# Patient Record
Sex: Male | Born: 1975 | Race: White | Hispanic: No | Marital: Married | Smoking: Current every day smoker
Health system: Southern US, Community
[De-identification: ages and names within clinical notes are randomized; demographics above are authoritative.]

## PROBLEM LIST (undated history)

## (undated) DIAGNOSIS — E669 Obesity, unspecified: Secondary | ICD-10-CM

## (undated) DIAGNOSIS — N200 Calculus of kidney: Secondary | ICD-10-CM

## (undated) DIAGNOSIS — I10 Essential (primary) hypertension: Secondary | ICD-10-CM

## (undated) DIAGNOSIS — T6391XA Toxic effect of contact with unspecified venomous animal, accidental (unintentional), initial encounter: Secondary | ICD-10-CM

## (undated) DIAGNOSIS — F17200 Nicotine dependence, unspecified, uncomplicated: Secondary | ICD-10-CM

## (undated) HISTORY — DX: Obesity, unspecified: E66.9

## (undated) HISTORY — DX: Nicotine dependence, unspecified, uncomplicated: F17.200

## (undated) HISTORY — DX: Calculus of kidney: N20.0

## (undated) HISTORY — DX: Toxic effect of contact with unspecified venomous animal, accidental (unintentional), initial encounter: T63.91XA

## (undated) HISTORY — DX: Essential (primary) hypertension: I10

---

## 2005-09-29 DIAGNOSIS — I10 Essential (primary) hypertension: Secondary | ICD-10-CM

## 2005-09-29 HISTORY — DX: Essential (primary) hypertension: I10

## 2012-10-25 ENCOUNTER — Ambulatory Visit (INDEPENDENT_AMBULATORY_CARE_PROVIDER_SITE_OTHER): Payer: BC Managed Care – PPO | Admitting: Family Medicine

## 2012-10-25 ENCOUNTER — Encounter: Payer: Self-pay | Admitting: Family Medicine

## 2012-10-25 VITALS — BP 128/90 | HR 75 | Temp 99.0°F | Resp 16 | Ht 72.0 in | Wt 266.0 lb

## 2012-10-25 DIAGNOSIS — R0989 Other specified symptoms and signs involving the circulatory and respiratory systems: Secondary | ICD-10-CM | POA: Insufficient documentation

## 2012-10-25 DIAGNOSIS — I10 Essential (primary) hypertension: Secondary | ICD-10-CM | POA: Insufficient documentation

## 2012-10-25 DIAGNOSIS — Z720 Tobacco use: Secondary | ICD-10-CM | POA: Insufficient documentation

## 2012-10-25 DIAGNOSIS — Z0000 Encounter for general adult medical examination without abnormal findings: Secondary | ICD-10-CM

## 2012-10-25 LAB — CBC WITH DIFFERENTIAL/PLATELET
Basophils Relative: 0 % (ref 0–1)
Eosinophils Absolute: 0.2 10*3/uL (ref 0.0–0.7)
Eosinophils Relative: 3 % (ref 0–5)
HCT: 49.1 % (ref 39.0–52.0)
Hemoglobin: 17.1 g/dL — ABNORMAL HIGH (ref 13.0–17.0)
MCH: 30.6 pg (ref 26.0–34.0)
MCHC: 34.8 g/dL (ref 30.0–36.0)
MCV: 88 fL (ref 78.0–100.0)
Monocytes Absolute: 0.5 10*3/uL (ref 0.1–1.0)
Monocytes Relative: 7 % (ref 3–12)
Neutro Abs: 4.7 10*3/uL (ref 1.7–7.7)

## 2012-10-25 LAB — HEMOGLOBIN A1C
Hgb A1c MFr Bld: 5.4 % (ref <5.7)
Mean Plasma Glucose: 108 mg/dL (ref <117)

## 2012-10-25 LAB — POCT URINALYSIS DIPSTICK
Bilirubin, UA: NEGATIVE
Blood, UA: NEGATIVE
Ketones, UA: NEGATIVE
Protein, UA: NEGATIVE
pH, UA: 5.5

## 2012-10-25 LAB — COMPREHENSIVE METABOLIC PANEL
Albumin: 4.2 g/dL (ref 3.5–5.2)
Alkaline Phosphatase: 65 U/L (ref 39–117)
BUN: 13 mg/dL (ref 6–23)
Creat: 0.66 mg/dL (ref 0.50–1.35)
Glucose, Bld: 84 mg/dL (ref 70–99)
Potassium: 4.3 mEq/L (ref 3.5–5.3)

## 2012-10-25 LAB — TSH: TSH: 2.976 u[IU]/mL (ref 0.350–4.500)

## 2012-10-25 LAB — LIPID PANEL
Cholesterol: 167 mg/dL (ref 0–200)
Total CHOL/HDL Ratio: 5.4 Ratio

## 2012-10-25 LAB — VITAMIN B12: Vitamin B-12: 738 pg/mL (ref 211–911)

## 2012-10-25 MED ORDER — LISINOPRIL 5 MG PO TABS: 5.0000 mg | ORAL_TABLET | Freq: Every day | ORAL | Status: DC

## 2012-10-25 NOTE — Assessment & Plan Note (Signed)
New. Send for carotid dopplers.

## 2012-10-25 NOTE — Progress Notes (Signed)
[address]   [place], [place]  [address]   [phone]  Subjective:    Patient ID: Kristopher Bell, male    DOB: 1975-12-09, 37 y.o.   MRN: [id number]  HPIThis 37 y.o. male presents for evaluation for CPE.    Last physical 2013.   Tetanus 2011. Influenza vaccines never. No previous colonoscopy. Eye exam never; no glasses or contacts. Dental exam 1-2 years ago; Earlene Plater on 54.     Review of Systems  Constitutional: Negative.   HENT: Negative.   Eyes: Negative.   Respiratory: Negative.   Cardiovascular: Negative.   Gastrointestinal: Negative.   Genitourinary: Negative.   Musculoskeletal: Negative.   Skin: Negative.   Neurological: Negative.   Hematological: Negative.   Psychiatric/Behavioral: Negative.     Past Medical History  Diagnosis Date  . Nephrolithiasis     age 8.  Single episode.  Harmon/Urologist.  . Hypertension 09/29/2005    onset age 61.    History reviewed. No pertinent past surgical history.  Prior to Admission medications   Medication Sig Start Date End Date Taking? Authorizing Provider  lisinopril (PRINIVIL,ZESTRIL) 5 MG tablet Take 1 tablet (5 mg total) by mouth daily. 10/25/12  Yes Ethelda Chick, MD    No Known Allergies  History   Social History  . Marital Status: Married    Spouse Name: N/A    Number of Children: N/A  . Years of Education: N/A   Occupational History  . Not on file.   Social History Main Topics  . Smoking status: Current Every Day Smoker -- 0.5 packs/day for 20 years    Types: Cigarettes  . Smokeless tobacco: Current User    Types: Snuff  . Alcohol Use: Yes     Comment: socially; twice yearly.  . Drug Use: No  . Sexually Active: Yes   Other Topics Concern  . Not on file   Social History Narrative   Marital status: married; married x 13 years.  Children: 2 sons   Lives: with wife, 2 sons.   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   Exercise: work is physically demanding; no formal exercise program.     Seatbelt:  50%  of time.   Guns: 15 guns; unloaded.     Sunscreen:  SPF 50.    Family History  Problem Relation Age of Onset  . Kidney disease Mother     spastic bladder, hematuria.  . Diverticulitis Mother   . Diabetes Father   . Stroke Father 66  . Hypertension Father 55  . Heart disease Father 40    cardiac stenting; no AMI.       Objective:   Physical Exam  Nursing note and vitals reviewed. Constitutional: He is oriented to person, place, and time. He appears well-developed and well-nourished. No distress.  HENT:  Head: Normocephalic and atraumatic.  Right Ear: External ear normal.  Left Ear: External ear normal.  Nose: Nose normal.  Mouth/Throat: Oropharynx is clear and moist.  Eyes: Conjunctivae normal and EOM are normal. Pupils are equal, round, and reactive to light.  Neck: Normal range of motion. Neck supple. No JVD present. No thyromegaly present.  Cardiovascular: Normal rate, regular rhythm, normal heart sounds and intact distal pulses.  Exam reveals no gallop and no friction rub.   No murmur heard. Pulses:      Carotid pulses are on the right side with bruit, and on the left side with bruit.      Carotid bruit R>L.  Pulmonary/Chest: Effort normal and breath sounds normal. He has no wheezes. He has no rales.  Abdominal: Soft. Bowel sounds are normal. He exhibits no mass. There is no tenderness. There is no rebound and no guarding. Hernia confirmed negative in the right inguinal area and confirmed negative in the left inguinal area.  Genitourinary: Testes normal and penis normal. Right testis shows no mass, no swelling and no tenderness. Left testis shows no mass, no swelling and no tenderness.  Musculoskeletal:       Right shoulder: Normal.       Left shoulder: Normal.       Cervical back: Normal.  Lymphadenopathy:    He has no cervical adenopathy.       Right: No inguinal adenopathy present.       Left: No inguinal adenopathy present.  Neurological: He is alert and oriented to  person, place, and time. He has normal reflexes. No cranial nerve deficit. He exhibits normal muscle tone. Coordination normal.  Skin: Skin is warm and dry. No rash noted. He is not diaphoretic. No erythema. No pallor.  Psychiatric: He has a normal mood and affect. His behavior is normal. Judgment and thought content normal.   EKG: NSR; NO ST CHANGES.  Results for orders placed in visit on 10/25/12  POCT URINALYSIS DIPSTICK      Component Value Range   Color, UA yellow     Clarity, UA clear     Glucose, UA neg     Bilirubin, UA neg     Ketones, UA neg     Spec Grav, UA 1.025     Blood, UA neg     pH, UA 5.5     Protein, UA neg     Urobilinogen, UA 0.2     Nitrite, UA neg     Leukocytes, UA Negative          Assessment & Plan:   1. Routine general medical examination at a health care facility  POCT urinalysis dipstick, CBC with Differential, Comprehensive metabolic panel, Hemoglobin A1c, Lipid panel, TSH, Vitamin B12, Vitamin D 25 hydroxy, EKG 12-Lead  2. Right carotid bruit  US Carotid Duplex Bilateral

## 2012-10-25 NOTE — Assessment & Plan Note (Signed)
Controlled; refill provided; no change to management; obtain labs.

## 2012-10-25 NOTE — Assessment & Plan Note (Signed)
Anticipatory guidance --- exercise, weight loss, smoking cessation.  Refused flu vaccine.  Obtain labs.

## 2012-10-25 NOTE — Patient Instructions (Addendum)
1. Routine general medical examination at a health care facility  POCT urinalysis dipstick, CBC with Differential, Comprehensive metabolic panel, Hemoglobin A1c, Lipid panel, TSH, Vitamin B12, Vitamin D 25 hydroxy, EKG 12-Lead  2. Right carotid bruit  US Carotid Duplex Bilateral

## 2012-10-25 NOTE — Assessment & Plan Note (Signed)
Pre-contemplative; encourage cessation.

## 2012-10-26 LAB — VITAMIN D 25 HYDROXY (VIT D DEFICIENCY, FRACTURES): Vit D, 25-Hydroxy: 28 ng/mL — ABNORMAL LOW (ref 30–89)

## 2012-10-29 ENCOUNTER — Ambulatory Visit
Admission: RE | Admit: 2012-10-29 | Discharge: 2012-10-29 | Disposition: A | Discharge status: Home / Self Care | Payer: BC Managed Care – PPO | Source: Ambulatory Visit | Attending: Family Medicine | Admitting: Family Medicine

## 2012-10-29 DIAGNOSIS — R0989 Other specified symptoms and signs involving the circulatory and respiratory systems: Secondary | ICD-10-CM

## 2012-11-22 ENCOUNTER — Other Ambulatory Visit: Payer: Self-pay | Admitting: Family Medicine

## 2012-11-22 MED ORDER — EPINEPHRINE 0.3 MG/0.3ML IJ DEVI: 0.3000 mg | Freq: Once | INTRAMUSCULAR | Status: DC

## 2012-12-09 ENCOUNTER — Encounter: Payer: Self-pay | Admitting: *Deleted

## 2013-04-25 ENCOUNTER — Ambulatory Visit (INDEPENDENT_AMBULATORY_CARE_PROVIDER_SITE_OTHER): Payer: BC Managed Care – PPO | Admitting: Family Medicine

## 2013-04-25 ENCOUNTER — Encounter: Payer: Self-pay | Admitting: Family Medicine

## 2013-04-25 VITALS — BP 128/90 | HR 83 | Temp 98.5°F | Resp 16 | Ht 72.0 in | Wt 272.8 lb

## 2013-04-25 DIAGNOSIS — I10 Essential (primary) hypertension: Secondary | ICD-10-CM

## 2013-04-25 DIAGNOSIS — E559 Vitamin D deficiency, unspecified: Secondary | ICD-10-CM | POA: Insufficient documentation

## 2013-04-25 DIAGNOSIS — F411 Generalized anxiety disorder: Secondary | ICD-10-CM

## 2013-04-25 DIAGNOSIS — E782 Mixed hyperlipidemia: Secondary | ICD-10-CM

## 2013-04-25 DIAGNOSIS — E041 Nontoxic single thyroid nodule: Secondary | ICD-10-CM

## 2013-04-25 DIAGNOSIS — R0989 Other specified symptoms and signs involving the circulatory and respiratory systems: Secondary | ICD-10-CM

## 2013-04-25 LAB — COMPREHENSIVE METABOLIC PANEL
Alkaline Phosphatase: 68 U/L (ref 39–117)
Glucose, Bld: 87 mg/dL (ref 70–99)
Sodium: 138 mEq/L (ref 135–145)
Total Bilirubin: 0.3 mg/dL (ref 0.3–1.2)
Total Protein: 6.4 g/dL (ref 6.0–8.3)

## 2013-04-25 LAB — CBC
Hemoglobin: 17.3 g/dL — ABNORMAL HIGH (ref 13.0–17.0)
MCH: 30.5 pg (ref 26.0–34.0)
MCHC: 34.1 g/dL (ref 30.0–36.0)
RDW: 13.8 % (ref 11.5–15.5)

## 2013-04-25 LAB — LIPID PANEL
Cholesterol: 193 mg/dL (ref 0–200)
Total CHOL/HDL Ratio: 5.8 Ratio
Triglycerides: 351 mg/dL — ABNORMAL HIGH (ref <150)
VLDL: 70 mg/dL — ABNORMAL HIGH (ref 0–40)

## 2013-04-25 MED ORDER — LISINOPRIL 5 MG PO TABS: 5.0000 mg | ORAL_TABLET | Freq: Every day | ORAL | Status: DC

## 2013-04-25 MED ORDER — CITALOPRAM HYDROBROMIDE 20 MG PO TABS: 20.0000 mg | ORAL_TABLET | Freq: Every day | ORAL | Status: DC

## 2013-04-25 NOTE — Assessment & Plan Note (Signed)
Stable; s/p carotid doppler that was negative for stenosis.

## 2013-04-25 NOTE — Assessment & Plan Note (Signed)
Controlled; agreeable to 1/2 tablet daily during summer months and with diastolic less than 60.  Obtain labs.  Refill provided.

## 2013-04-25 NOTE — Assessment & Plan Note (Signed)
New.  Non-compliance with supplement; repeat labs.

## 2013-04-25 NOTE — Assessment & Plan Note (Signed)
New. Counseled extensively during visit; rx for Citalopram 20mg  one daily provided.  Multiple work and family stressors.

## 2013-04-25 NOTE — Assessment & Plan Note (Signed)
New.  Pt declined thyroid ultrasound due to expense and work demands; agreeable due to small size.  Will order at next visit.  TSH normal in 09/2012.

## 2013-04-25 NOTE — Assessment & Plan Note (Signed)
New. Tolerating Fish oil two bid; obtain labs.  Recommend weight loss, exercise, low-cholesterol and low fat food choices.

## 2013-04-25 NOTE — Progress Notes (Signed)
[address]   [place], [place]  [address]   [phone]  Subjective:    Patient ID: Kristopher Bell, male    DOB: 09-Sep-1976, 37 y.o.   MRN: [id number]  HPI This 37 y.o. male presents for six month follow-up:  1. HTN: six month follow-up on medications; no changes to management made at last visit.  Reports good compliance with medication, good tolerance to medication; good symptom control.   Home BP readings running 115-135/54-70.  Compliance with medication.  Sometimes diastolic readings 50; will get dizzy at work during the hot summer months; would like to take 1/2 tablet of Lisinopril on low diastolic days.  2. Carotid bruit: s/p carotid doppler that was negative for stenosis.    3. Dyslipidemia:  Elevated triglycerides and low HDL at CPE six months ago; Started taking Fish Oil pills two bid.    4.  Vitamin D deficiency:  Did not start vitamin D at last visit.  5. Thyroid nodule: detected on carotid doppler in 09/2012.   Reluctant to undergo thyroid ultrasound due to financial issues and work demands.  6.  Short tempered:  Onset 4-5 months ago.  Works with a Estate manager/land agent at work.  Now getting short also with wife and son.  Lots of work and family stressors.  42 year old step son ran away this summer and is now living with biological father.  Very stressful.  Does not like feeling this way.     Review of Systems  Constitutional: Negative for chills, diaphoresis, activity change, appetite change and fatigue.  Eyes: Negative for photophobia and visual disturbance.  Respiratory: Negative for shortness of breath, wheezing and stridor.   Cardiovascular: Negative for chest pain, palpitations and leg swelling.  Gastrointestinal: Negative for nausea, vomiting and abdominal pain.  Endocrine: Negative for cold intolerance, heat intolerance, polydipsia, polyphagia and polyuria.  Neurological: Positive for dizziness and light-headedness. Negative for tremors, seizures, syncope, facial  asymmetry, speech difficulty, weakness, numbness and headaches.  Psychiatric/Behavioral: Positive for agitation. Negative for suicidal ideas, hallucinations, behavioral problems, confusion, sleep disturbance, self-injury, dysphoric mood and decreased concentration. The patient is nervous/anxious.    Past Medical History  Diagnosis Date  . Nephrolithiasis     age 79.  Single episode.  Harmon/Urologist.  . Hypertension 09/29/2005    onset age 82.  Marland Kitchen Toxic effect of venom   . Obesity, unspecified   . Tobacco use disorder   . Carpal tunnel syndrome    Current Outpatient Prescriptions on File Prior to Visit  Medication Sig Dispense Refill  . EPINEPHrine (EPIPEN) 0.3 mg/0.3 mL DEVI Inject 0.3 mLs (0.3 mg total) into the muscle once.  2 Device  2   No current facility-administered medications on file prior to visit.   History   Social History  . Marital Status: Married    Spouse Name: N/A    Number of Children: 2  . Years of Education: 12   Occupational History  . 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 Smoking status: Current Every Day Smoker -- 0.50 packs/day for 20 years    Types: Cigarettes  . Smokeless tobacco: Current User    Types: Snuff  . Alcohol Use: No     Comment: socially; twice yearly.  . Drug Use: No  . Sexually Active: Yes   Other Topics Concern  . Not on file   Social History Narrative   Marital status: married; married x 13  years. Feels safe at home.     Children: 2 sons      Lives: with wife, 2 sons.      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      Exercise: work is physically demanding; no formal exercise program.        Seatbelt:  50% of time.      Guns: 15 guns; unloaded.        Sunscreen:  SPF 50.        Smoke alarm in the home.      Caffeine use:Moderate amount.       Objective:   Physical Exam  Nursing note and vitals reviewed. Constitutional: He is oriented to person, place, and time. He appears well-developed and  well-nourished. No distress.  HENT:  Head: Normocephalic and atraumatic.  Right Ear: External ear normal.  Left Ear: External ear normal.  Nose: Nose normal.  Mouth/Throat: Oropharynx is clear and moist.  Eyes: Conjunctivae and EOM are normal. Pupils are equal, round, and reactive to light.  Neck: Normal range of motion. Neck supple. No thyromegaly present.  Cardiovascular: Normal rate, regular rhythm and normal heart sounds.  Exam reveals no gallop and no friction rub.   No murmur heard. Pulmonary/Chest: Effort normal and breath sounds normal. No respiratory distress. He has no wheezes. He has no rales.  Lymphadenopathy:    He has no cervical adenopathy.  Neurological: He is alert and oriented to person, place, and time. No cranial nerve deficit. He exhibits normal muscle tone. Coordination normal.  Skin: He is not diaphoretic.  Psychiatric: He has a normal mood and affect. His behavior is normal. Judgment and thought content normal.        Assessment & Plan:

## 2013-10-24 ENCOUNTER — Encounter: Payer: BC Managed Care – PPO | Admitting: Family Medicine

## 2013-12-13 ENCOUNTER — Encounter: Payer: Self-pay | Admitting: Family Medicine

## 2013-12-13 ENCOUNTER — Ambulatory Visit (INDEPENDENT_AMBULATORY_CARE_PROVIDER_SITE_OTHER): Payer: BC Managed Care – PPO | Admitting: Family Medicine

## 2013-12-13 VITALS — BP 130/89 | HR 75 | Temp 98.7°F | Resp 16 | Ht 71.5 in | Wt 267.8 lb

## 2013-12-13 DIAGNOSIS — Z91030 Bee allergy status: Secondary | ICD-10-CM

## 2013-12-13 DIAGNOSIS — I10 Essential (primary) hypertension: Secondary | ICD-10-CM

## 2013-12-13 DIAGNOSIS — Z0000 Encounter for general adult medical examination without abnormal findings: Secondary | ICD-10-CM

## 2013-12-13 DIAGNOSIS — E041 Nontoxic single thyroid nodule: Secondary | ICD-10-CM

## 2013-12-13 DIAGNOSIS — Z720 Tobacco use: Secondary | ICD-10-CM

## 2013-12-13 DIAGNOSIS — F17200 Nicotine dependence, unspecified, uncomplicated: Secondary | ICD-10-CM

## 2013-12-13 DIAGNOSIS — L309 Dermatitis, unspecified: Secondary | ICD-10-CM

## 2013-12-13 LAB — CBC WITH DIFFERENTIAL/PLATELET
Basophils Absolute: 0 10*3/uL (ref 0.0–0.1)
Basophils Relative: 0 % (ref 0–1)
EOS PCT: 4 % (ref 0–5)
Eosinophils Absolute: 0.3 10*3/uL (ref 0.0–0.7)
HEMATOCRIT: 48.8 % (ref 39.0–52.0)
HEMOGLOBIN: 17 g/dL (ref 13.0–17.0)
LYMPHS ABS: 1.8 10*3/uL (ref 0.7–4.0)
LYMPHS PCT: 23 % (ref 12–46)
MCH: 30.6 pg (ref 26.0–34.0)
MCHC: 34.8 g/dL (ref 30.0–36.0)
MCV: 87.8 fL (ref 78.0–100.0)
MONO ABS: 0.5 10*3/uL (ref 0.1–1.0)
MONOS PCT: 7 % (ref 3–12)
NEUTROS ABS: 5.1 10*3/uL (ref 1.7–7.7)
Neutrophils Relative %: 66 % (ref 43–77)
Platelets: 197 10*3/uL (ref 150–400)
RBC: 5.56 MIL/uL (ref 4.22–5.81)
RDW: 14 % (ref 11.5–15.5)
WBC: 7.7 10*3/uL (ref 4.0–10.5)

## 2013-12-13 LAB — POCT URINALYSIS DIPSTICK
BILIRUBIN UA: NEGATIVE
Blood, UA: NEGATIVE
Glucose, UA: NEGATIVE
Ketones, UA: NEGATIVE
Leukocytes, UA: NEGATIVE
NITRITE UA: POSITIVE
Protein, UA: NEGATIVE
Spec Grav, UA: >=1.03
UROBILINOGEN UA: 0.2
pH, UA: 5.5

## 2013-12-13 LAB — COMPLETE METABOLIC PANEL WITH GFR
ALT: 21 U/L (ref 0–53)
AST: 14 U/L (ref 0–37)
Albumin: 4 g/dL (ref 3.5–5.2)
Alkaline Phosphatase: 68 U/L (ref 39–117)
BUN: 13 mg/dL (ref 6–23)
CALCIUM: 8.9 mg/dL (ref 8.4–10.5)
CHLORIDE: 105 meq/L (ref 96–112)
CO2: 26 meq/L (ref 19–32)
CREATININE: 0.65 mg/dL (ref 0.50–1.35)
GFR, Est African American: >89 mL/min
GLUCOSE: 84 mg/dL (ref 70–99)
Potassium: 4.3 mEq/L (ref 3.5–5.3)
Sodium: 138 mEq/L (ref 135–145)
Total Bilirubin: 0.6 mg/dL (ref 0.2–1.2)
Total Protein: 6.2 g/dL (ref 6.0–8.3)

## 2013-12-13 LAB — LIPID PANEL
Cholesterol: 155 mg/dL (ref 0–200)
HDL: 35 mg/dL — ABNORMAL LOW (ref >39)
LDL CALC: 86 mg/dL (ref 0–99)
Total CHOL/HDL Ratio: 4.4 Ratio
Triglycerides: 169 mg/dL — ABNORMAL HIGH (ref <150)
VLDL: 34 mg/dL (ref 0–40)

## 2013-12-13 LAB — HEMOGLOBIN A1C
Hgb A1c MFr Bld: 5.1 % (ref <5.7)
MEAN PLASMA GLUCOSE: 100 mg/dL (ref <117)

## 2013-12-13 MED ORDER — HYDROCORTISONE 2.5 % EX OINT: TOPICAL_OINTMENT | Freq: Two times a day (BID) | CUTANEOUS | Status: DC

## 2013-12-13 MED ORDER — LISINOPRIL 5 MG PO TABS: 5.0000 mg | ORAL_TABLET | Freq: Every day | ORAL | Status: DC

## 2013-12-13 MED ORDER — EPINEPHRINE 0.3 MG/0.3ML IJ SOAJ: 0.3000 mg | Freq: Once | INTRAMUSCULAR | Status: DC

## 2013-12-13 NOTE — Progress Notes (Signed)
Subjective:    Patient ID: Kristopher Bell, male    DOB: 06/14/1976, 38 y.o.   MRN: [id number]  12/13/2013  Annual Exam   HPI This 38 y.o. male presents for Complete Physical  Exam.  Last physical 10/22/2012. Tetanus 2011. Flu vaccines never/refuses. No previous colonoscopy. Eye exam never. Dental exam never in adult life.   Thyroid nodule:  Never underwent thyroid ultrasound due to expense of test.    Anger/irritability:  Took medication for two days and stopped; transferred to another store; was having major issues with supervisor.  Now mood much improved; wife is agreeable.  HTN: Patient reports good compliance with medication, good tolerance to medication, and good symptom control.  Home BP Running 155/65-138/78.   Review of Systems  Constitutional: Negative for fever, chills, diaphoresis, activity change, appetite change, fatigue and unexpected weight change.  HENT: Negative for congestion, dental problem, drooling, ear discharge, ear pain, facial swelling, hearing loss, mouth sores, nosebleeds, postnasal drip, rhinorrhea, sinus pressure, sneezing, sore throat, tinnitus, trouble swallowing and voice change.   Eyes: Negative for photophobia, pain, discharge, redness, itching and visual disturbance.  Respiratory: Negative for apnea, cough, choking, chest tightness, shortness of breath, wheezing and stridor.   Cardiovascular: Negative for chest pain, palpitations and leg swelling.  Gastrointestinal: Negative for nausea, vomiting, abdominal pain, diarrhea, constipation and blood in stool.  Endocrine: Negative for cold intolerance, heat intolerance, polydipsia, polyphagia and polyuria.  Genitourinary: Negative for dysuria, urgency, frequency, hematuria, flank pain, decreased urine volume, discharge, penile swelling, scrotal swelling, enuresis, difficulty urinating, genital sores, penile pain and testicular pain.  Musculoskeletal: Negative for myalgias, back pain, joint swelling,  arthralgias, gait problem, neck pain and neck stiffness.  Skin: Negative for color change, pallor, rash and wound.  Allergic/Immunologic: Negative for environmental allergies, food allergies and immunocompromised state.  Neurological: Negative for dizziness, tremors, seizures, syncope, facial asymmetry, speech difficulty, weakness, light-headedness, numbness and headaches.  Hematological: Negative for adenopathy. Does not bruise/bleed easily.  Psychiatric/Behavioral: Negative for suicidal ideas, hallucinations, behavioral problems, confusion, sleep disturbance, self-injury, dysphoric mood, decreased concentration and agitation. The patient is not nervous/anxious and is not hyperactive.     Past Medical History  Diagnosis Date  . Nephrolithiasis     age 38.  Single episode.  Harmon/Urologist.  . Hypertension 09/29/2005    onset age 38.  Marland Kitchen. Toxic effect of venom   . Obesity, unspecified   . Tobacco use disorder   . Carpal tunnel syndrome    Allergies  Allergen Reactions  . Bee Venom     Yellow jackets   Current Outpatient Prescriptions  Medication Sig Dispense Refill  . EPINEPHrine (EPIPEN) 0.3 mg/0.3 mL DEVI Inject 0.3 mLs (0.3 mg total) into the muscle once. 2 Device 2  . lisinopril (PRINIVIL,ZESTRIL) 5 MG tablet Take 1 tablet (5 mg total) by mouth daily. 90 tablet 3  . citalopram (CELEXA) 20 MG tablet Take 1 tablet (20 mg total) by mouth daily. 90 tablet 1  . EPINEPHrine (EPIPEN 2-PAK) 0.3 mg/0.3 mL SOAJ injection Inject 0.3 mLs (0.3 mg total) into the muscle once. 1 Device 1  . hydrocortisone 2.5 % ointment Apply topically 2 (two) times daily. 30 g 1   No current facility-administered medications for this visit.       Objective:    BP 130/89 mmHg  Pulse 75  Temp(Src) 98.7 F (37.1 C) (Oral)  Resp 16  Ht 5' 11.5" (1.816 m)  Wt 267 lb 12.8 oz (121.473 kg)  BMI 36.83 kg/m2  SpO2 97% Physical Exam  Constitutional: He is oriented to person, place, and time. He appears  well-developed and well-nourished. No distress.  HENT:  Head: Normocephalic and atraumatic.  Right Ear: External ear normal.  Left Ear: External ear normal.  Nose: Nose normal.  Mouth/Throat: Oropharynx is clear and moist.  Eyes: Conjunctivae and EOM are normal. Pupils are equal, round, and reactive to light.  Neck: Normal range of motion. Neck supple. Carotid bruit is not present. No thyromegaly present.  Cardiovascular: Normal rate, regular rhythm, normal heart sounds and intact distal pulses.  Exam reveals no gallop and no friction rub.   No murmur heard. Pulmonary/Chest: Effort normal and breath sounds normal. He has no wheezes. He has no rales.  Abdominal: Soft. Bowel sounds are normal. He exhibits no distension and no mass. There is no tenderness. There is no rebound and no guarding. Hernia confirmed negative in the right inguinal area and confirmed negative in the left inguinal area.  Genitourinary: Penis normal. Right testis shows no mass, no swelling and no tenderness. Left testis shows no mass, no swelling and no tenderness.  Musculoskeletal:       Right shoulder: Normal.       Left shoulder: Normal.       Cervical back: Normal.  Lymphadenopathy:    He has no cervical adenopathy.       Right: No inguinal adenopathy present.       Left: No inguinal adenopathy present.  Neurological: He is alert and oriented to person, place, and time. He has normal reflexes. No cranial nerve deficit. He exhibits normal muscle tone. Coordination normal.  Skin: Skin is warm and dry. Rash noted. He is not diaphoretic.  Psychiatric: He has a normal mood and affect. His behavior is normal. Judgment and thought content normal.        Assessment & Plan:  Routine general medical examination at a health care facility - Plan: CBC with Differential, COMPLETE METABOLIC PANEL WITH GFR, TSH, Lipid panel, POCT urinalysis dipstick, EKG 12-Lead, Hemoglobin A1c  Essential hypertension, benign - Plan:  lisinopril (PRINIVIL,ZESTRIL) 5 MG tablet  Tobacco abuse  Thyroid nodule  Bee allergy status  Dermatitis  1.  Complete Physical Examination: anticipatory guidance --- weight loss, exercise, smoking cessation.  Immunizations reviewed; pt refused flu vaccine. Safe driving practices reviewed. 2.  HTN: controlled; obtain labs, u/a, EKG. Refill provided; follow-up six months. 3.  Thyroid nodule: stable; non-compliance with thyroid US due to expense; obtain TSH. 4.  Tobacco abuse: pre-contemplative at this time. 5.  Bee allergy: refill of Epipen provided. 6. Dermatitis: New. Rx for hydrocortisone provided.   Meds ordered this encounter  Medications  . lisinopril (PRINIVIL,ZESTRIL) 5 MG tablet    Sig: Take 1 tablet (5 mg total) by mouth daily.    Dispense:  90 tablet    Refill:  3  . EPINEPHrine (EPIPEN 2-PAK) 0.3 mg/0.3 mL SOAJ injection    Sig: Inject 0.3 mLs (0.3 mg total) into the muscle once.    Dispense:  1 Device    Refill:  1  . hydrocortisone 2.5 % ointment    Sig: Apply topically 2 (two) times daily.    Dispense:  30 g    Refill:  1    Return in about 6 months (around 06/15/2014) for recheck high blood pressure.     Nilda Simmer, M.D.  [facility] [address] [place], [place]  [address] [phone] phone [phone] fax

## 2013-12-13 NOTE — Progress Notes (Signed)
   Subjective:    Patient ID: Kristopher DownsGary Bell, male    DOB: 11/16/1975, 38 y.o.   MRN: [id number]  HPI    Review of Systems  Constitutional: Negative.   HENT: Negative.   Eyes: Negative.   Respiratory: Negative.   Cardiovascular: Negative.   Gastrointestinal: Negative.   Endocrine: Negative.   Genitourinary: Negative.   Musculoskeletal: Negative.   Skin: Negative.   Allergic/Immunologic: Negative.   Neurological: Negative.   Hematological: Negative.   Psychiatric/Behavioral: Negative.        Objective:   Physical Exam        Assessment & Plan:

## 2013-12-13 NOTE — Patient Instructions (Signed)
1.  RECOMMEND STOP SMOKING.   2.  RECOMMEND DENTAL APPOINTMENT. 3.  RECOMMEND EVALUATION BY A DERMATOLOGIST TO LOOK AT MOLES. 4.  CONSIDER SLEEP STUDY TO EVALUATE FOR SLEEP APNEA. 5.  RECOMMEND STOPPING SMOKING AND DIPPING.

## 2013-12-14 LAB — TSH: TSH: 2.258 u[IU]/mL (ref 0.350–4.500)

## 2013-12-18 ENCOUNTER — Encounter: Payer: Self-pay | Admitting: Family Medicine

## 2014-06-05 ENCOUNTER — Ambulatory Visit: Payer: BC Managed Care – PPO | Admitting: Family Medicine

## 2014-06-12 ENCOUNTER — Ambulatory Visit: Payer: BC Managed Care – PPO | Admitting: Family Medicine

## 2014-08-01 DIAGNOSIS — Z91030 Bee allergy status: Secondary | ICD-10-CM | POA: Insufficient documentation

## 2014-12-02 IMAGING — US US CAROTID DUPLEX BILAT
1 series · 13 of 24 positions shown · non-contrast
Comparison: None available

CLINICAL DATA: Bilateral carotid bruit.  Hypertension.  Tobacco
use.

BILATERAL CAROTID DUPLEX ULTRASOUND
TECHNIQUE: Gray scale imaging, color Doppler and duplex ultrasound
was performed of bilateral carotid and vertebral arteries in the
neck.

[Series 1: us carotid duplex bilat · 0.09mm/px · 13 of 55 slices shown]
[im 1/55]
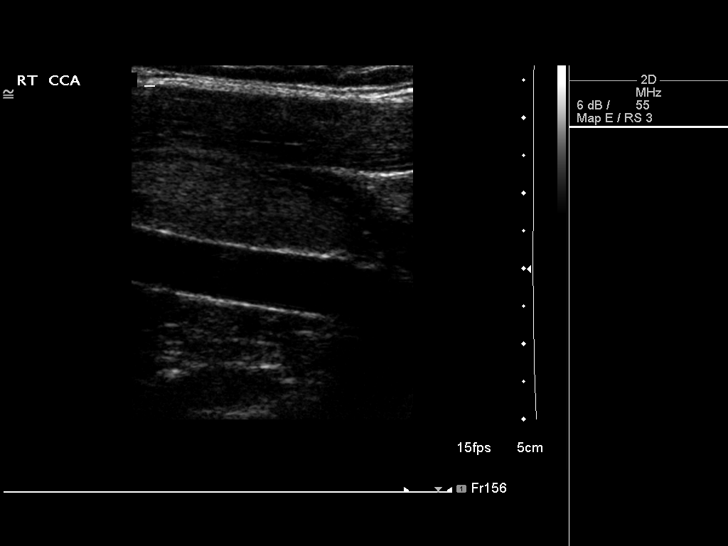
[im 5/55]
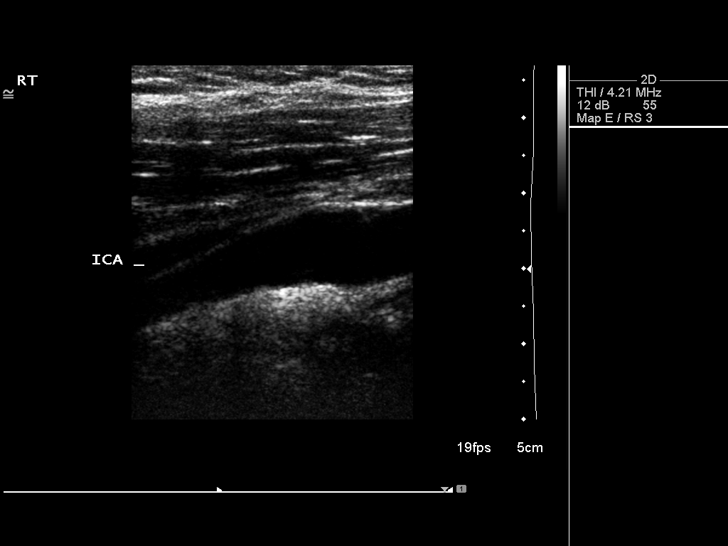
[im 10/55]
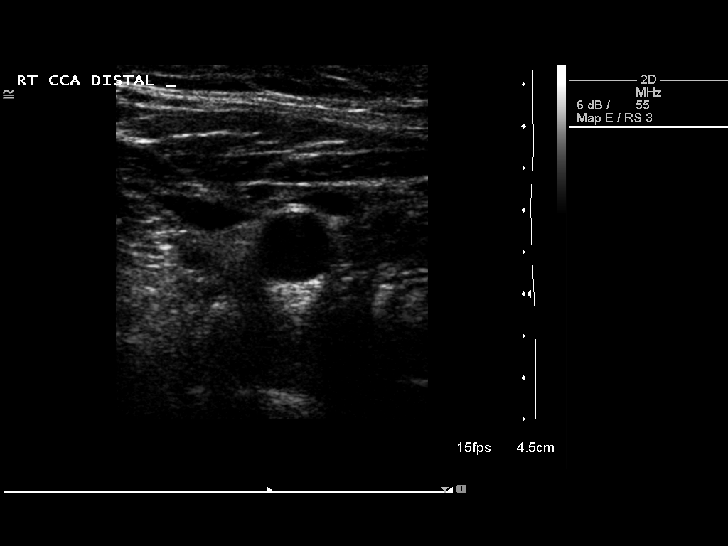
[im 15/55]
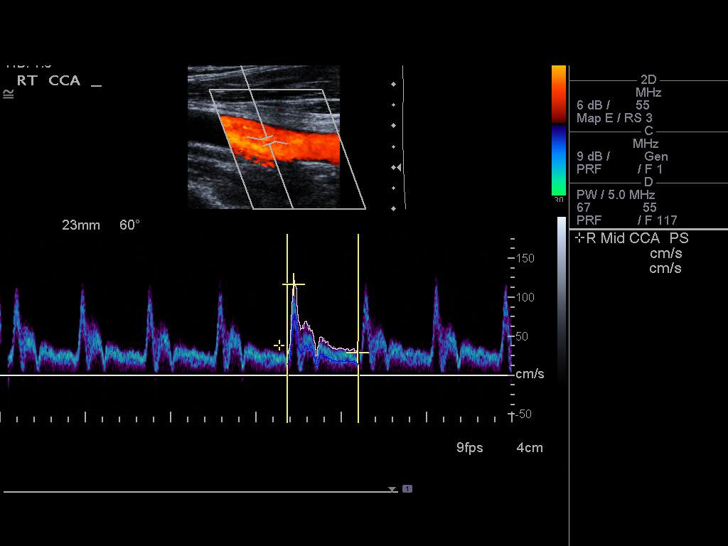
[im 19/55]
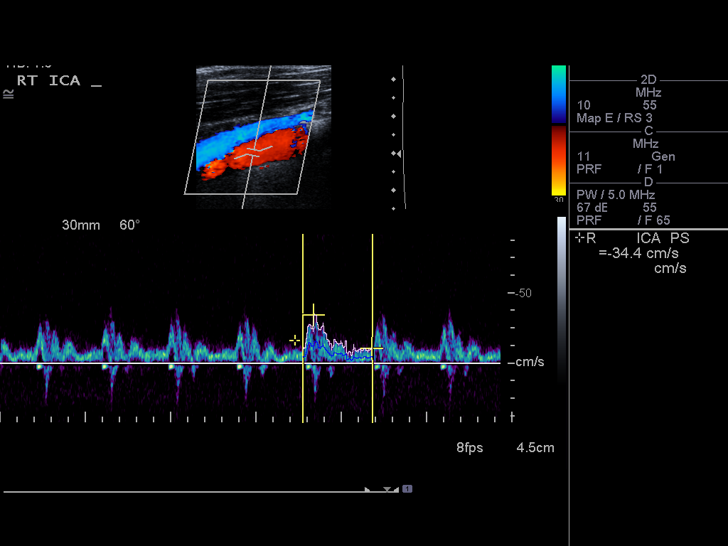
[im 24/55]
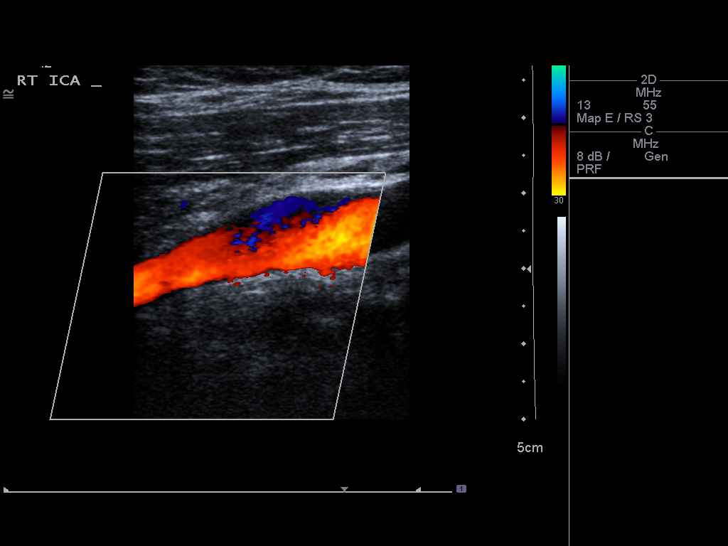
[im 29/55]
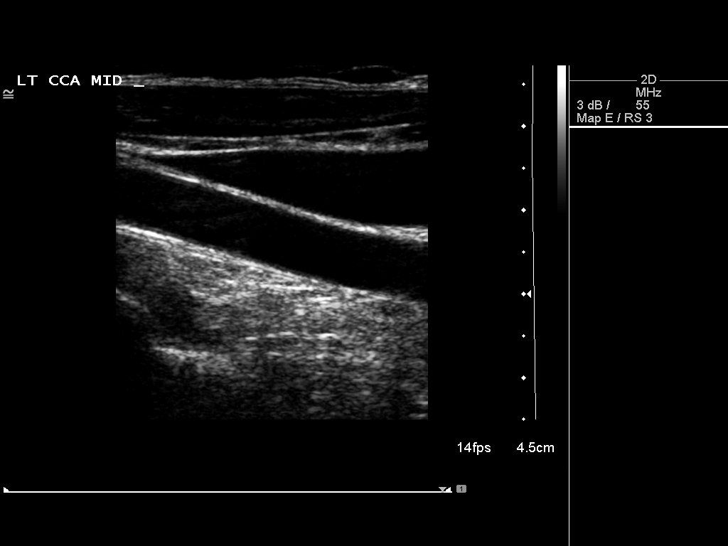
[im 31/55]
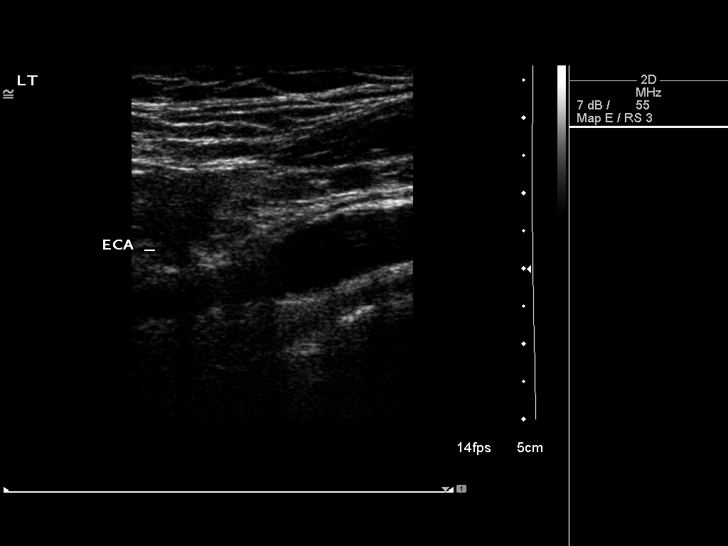
[im 36/55]
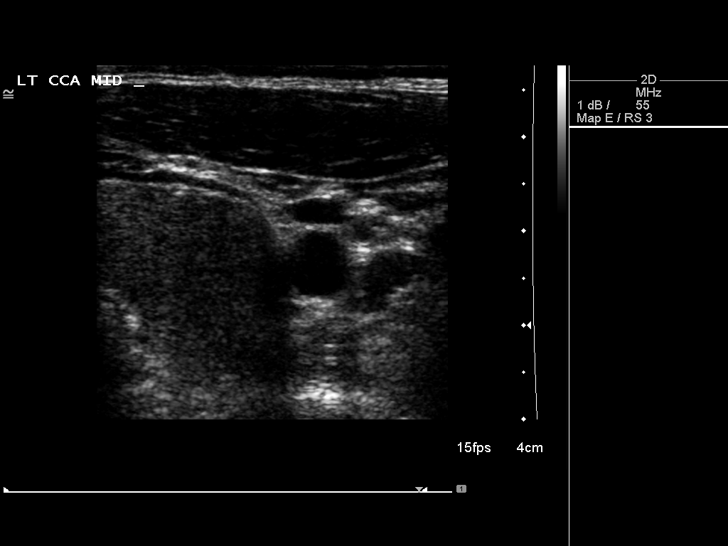
[im 40/55]
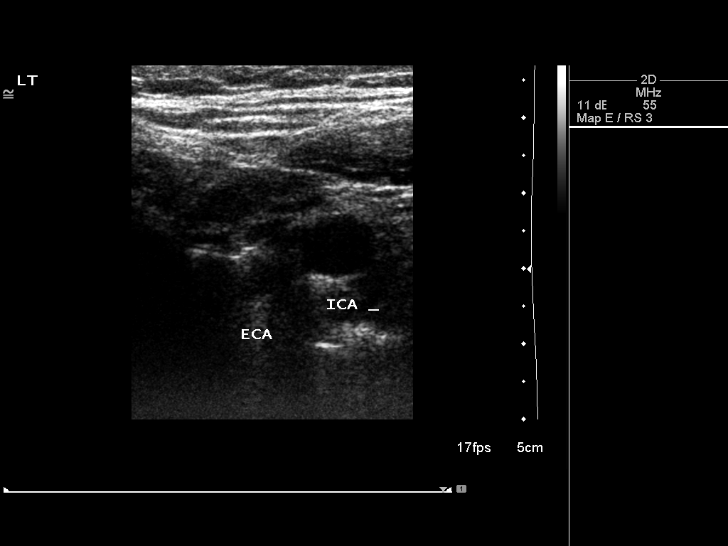
[im 45/55]
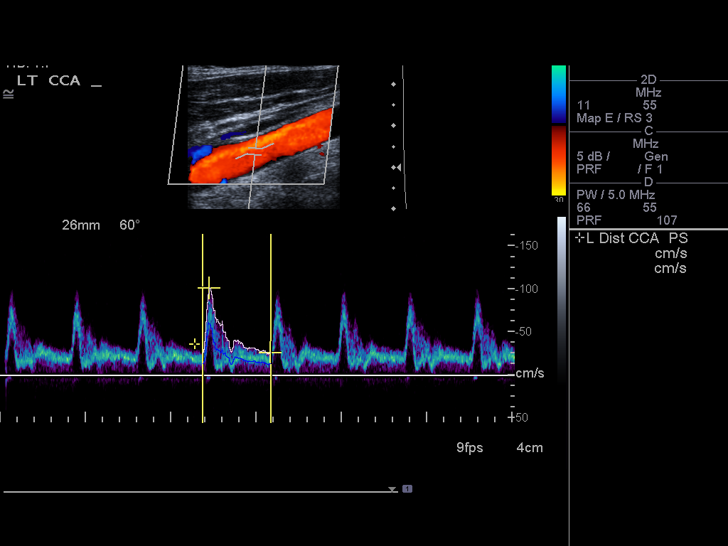
[im 50/55]
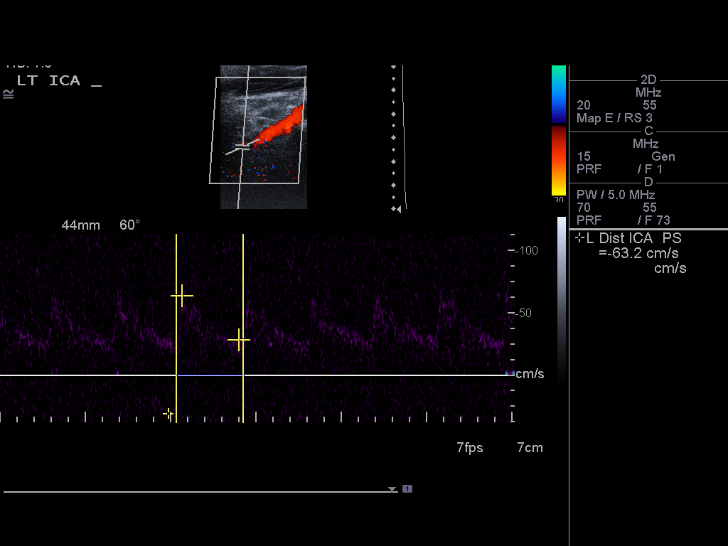
[im 55/55]
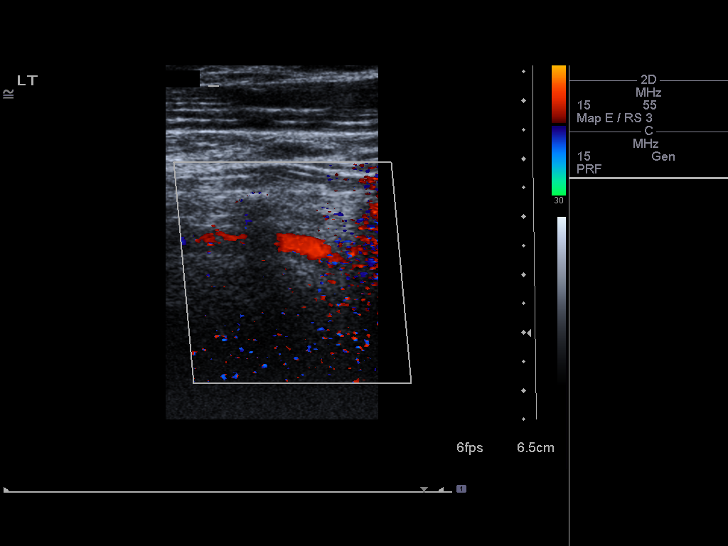

[13 of 24 positions shown; findings below may reference images not displayed]

Criteria:  Quantification of carotid stenosis is based on velocity
parameters that correlate the residual internal carotid diameter
with NASCET-based stenosis levels, using the diameter of the distal
internal carotid lumen as the denominator for stenosis measurement.

The following velocity measurements were obtained:

                 PEAK SYSTOLIC/END DIASTOLIC
RIGHT
ICA:                        78/29cm/sec
CCA:                        121/25cm/sec
SYSTOLIC ICA/CCA RATIO:
DIASTOLIC ICA/CCA RATIO:
ECA:                        120cm/sec

LEFT
ICA:                        65/27cm/sec
CCA:                        141/35cm/sec
SYSTOLIC ICA/CCA RATIO:
DIASTOLIC ICA/CCA RATIO:
ECA:                        82cm/sec
FINDINGS: RIGHT CAROTID ARTERY: No significant plaque accumulation or
stenosis.  Normal wave forms and color Doppler signal.

RIGHT VERTEBRAL ARTERY:  Normal flow direction and waveform. ]

LEFT CAROTID ARTERY: No significant plaque accumulation or
stenosis.  Normal wave forms and color Doppler signal.  And 8 mm
hypoechoic left isthmus nodule incidentally noted.  Normal wave
forms and color Doppler signal.]

LEFT VERTEBRAL ARTERY:  Normal flow direction and waveform.
IMPRESSION: 1.  Negative for significant carotid bifurcation plaque or
stenosis.
2. Sub-centimeter thyroid nodule   noted, too small to
characterize, but most likely benign in the absence of known
clinical risk factors for thyroid carcinoma.

## 2015-05-28 ENCOUNTER — Ambulatory Visit (INDEPENDENT_AMBULATORY_CARE_PROVIDER_SITE_OTHER): Payer: PRIVATE HEALTH INSURANCE | Admitting: Family Medicine

## 2015-05-28 ENCOUNTER — Encounter: Payer: Self-pay | Admitting: Family Medicine

## 2015-05-28 VITALS — BP 130/90 | HR 79 | Temp 98.1°F | Resp 16 | Ht 72.0 in | Wt 276.8 lb

## 2015-05-28 DIAGNOSIS — Z720 Tobacco use: Secondary | ICD-10-CM | POA: Diagnosis not present

## 2015-05-28 DIAGNOSIS — E669 Obesity, unspecified: Secondary | ICD-10-CM | POA: Diagnosis not present

## 2015-05-28 DIAGNOSIS — F418 Other specified anxiety disorders: Secondary | ICD-10-CM

## 2015-05-28 DIAGNOSIS — Z91030 Bee allergy status: Secondary | ICD-10-CM

## 2015-05-28 DIAGNOSIS — Z131 Encounter for screening for diabetes mellitus: Secondary | ICD-10-CM | POA: Diagnosis not present

## 2015-05-28 DIAGNOSIS — Z6837 Body mass index (BMI) 37.0-37.9, adult: Secondary | ICD-10-CM

## 2015-05-28 DIAGNOSIS — F419 Anxiety disorder, unspecified: Secondary | ICD-10-CM

## 2015-05-28 DIAGNOSIS — Z0000 Encounter for general adult medical examination without abnormal findings: Secondary | ICD-10-CM | POA: Diagnosis not present

## 2015-05-28 DIAGNOSIS — I10 Essential (primary) hypertension: Secondary | ICD-10-CM | POA: Diagnosis not present

## 2015-05-28 DIAGNOSIS — F329 Major depressive disorder, single episode, unspecified: Secondary | ICD-10-CM

## 2015-05-28 LAB — HEMOGLOBIN A1C
HEMOGLOBIN A1C: 5.3 % (ref <5.7)
MEAN PLASMA GLUCOSE: 105 mg/dL (ref <117)

## 2015-05-28 LAB — CBC WITH DIFFERENTIAL/PLATELET
BASOS PCT: 0 % (ref 0–1)
Basophils Absolute: 0 10*3/uL (ref 0.0–0.1)
Eosinophils Absolute: 0.3 10*3/uL (ref 0.0–0.7)
Eosinophils Relative: 3 % (ref 0–5)
HCT: 48.4 % (ref 39.0–52.0)
HEMOGLOBIN: 16.6 g/dL (ref 13.0–17.0)
Lymphocytes Relative: 22 % (ref 12–46)
Lymphs Abs: 1.9 10*3/uL (ref 0.7–4.0)
MCH: 30.2 pg (ref 26.0–34.0)
MCHC: 34.3 g/dL (ref 30.0–36.0)
MCV: 88 fL (ref 78.0–100.0)
MPV: 10.1 fL (ref 8.6–12.4)
Monocytes Absolute: 0.5 10*3/uL (ref 0.1–1.0)
Monocytes Relative: 6 % (ref 3–12)
NEUTROS ABS: 6.1 10*3/uL (ref 1.7–7.7)
NEUTROS PCT: 69 % (ref 43–77)
Platelets: 166 10*3/uL (ref 150–400)
RBC: 5.5 MIL/uL (ref 4.22–5.81)
RDW: 14.1 % (ref 11.5–15.5)
WBC: 8.8 10*3/uL (ref 4.0–10.5)

## 2015-05-28 LAB — POCT URINALYSIS DIPSTICK
Bilirubin, UA: NEGATIVE
Blood, UA: NEGATIVE
Glucose, UA: NEGATIVE
Ketones, UA: NEGATIVE
Leukocytes, UA: NEGATIVE
Nitrite, UA: NEGATIVE
PROTEIN UA: NEGATIVE
Spec Grav, UA: 1.025
UROBILINOGEN UA: 0.2
pH, UA: 5

## 2015-05-28 LAB — COMPREHENSIVE METABOLIC PANEL
ALBUMIN: 4.3 g/dL (ref 3.6–5.1)
ALK PHOS: 66 U/L (ref 40–115)
ALT: 21 U/L (ref 9–46)
AST: 12 U/L (ref 10–40)
BUN: 13 mg/dL (ref 7–25)
CALCIUM: 8.7 mg/dL (ref 8.6–10.3)
CHLORIDE: 105 mmol/L (ref 98–110)
CO2: 26 mmol/L (ref 20–31)
Creat: 0.68 mg/dL (ref 0.60–1.35)
Glucose, Bld: 86 mg/dL (ref 65–99)
POTASSIUM: 4.9 mmol/L (ref 3.5–5.3)
Sodium: 142 mmol/L (ref 135–146)
TOTAL PROTEIN: 6.2 g/dL (ref 6.1–8.1)
Total Bilirubin: 0.4 mg/dL (ref 0.2–1.2)

## 2015-05-28 LAB — VITAMIN B12: VITAMIN B 12: 721 pg/mL (ref 211–911)

## 2015-05-28 LAB — TSH: TSH: 2.673 u[IU]/mL (ref 0.350–4.500)

## 2015-05-28 LAB — LIPID PANEL
Cholesterol: 157 mg/dL (ref 125–200)
HDL: 29 mg/dL — AB (ref >=40)
LDL CALC: 82 mg/dL (ref <130)
TRIGLYCERIDES: 228 mg/dL — AB (ref <150)
Total CHOL/HDL Ratio: 5.4 Ratio — ABNORMAL HIGH (ref <=5.0)
VLDL: 46 mg/dL — ABNORMAL HIGH (ref <30)

## 2015-05-28 MED ORDER — MELOXICAM 15 MG PO TABS: 15.0000 mg | ORAL_TABLET | Freq: Every day | ORAL | Status: DC

## 2015-05-28 MED ORDER — LISINOPRIL 5 MG PO TABS: 5.0000 mg | ORAL_TABLET | Freq: Every day | ORAL | Status: DC

## 2015-05-28 MED ORDER — METHOCARBAMOL 500 MG PO TABS: 500.0000 mg | ORAL_TABLET | Freq: Three times a day (TID) | ORAL | Status: DC | PRN

## 2015-05-28 MED ORDER — EPINEPHRINE 0.3 MG/0.3ML IJ SOAJ: 0.3000 mg | Freq: Once | INTRAMUSCULAR | Status: DC

## 2015-05-28 NOTE — Progress Notes (Signed)
   Subjective:    Patient ID: Kristopher Bell, male    DOB: 1976/07/20, 39 y.o.   MRN: [id number]  HPI    Review of Systems  Constitutional: Negative.   HENT: Negative.   Eyes: Negative.   Respiratory: Negative.   Cardiovascular: Negative.   Gastrointestinal: Negative.   Endocrine: Negative.   Genitourinary: Negative.   Musculoskeletal: Negative.   Skin: Negative.   Allergic/Immunologic: Negative.   Neurological: Negative.   Hematological: Negative.   Psychiatric/Behavioral: Negative.        Objective:   Physical Exam        Assessment & Plan:

## 2015-05-28 NOTE — Progress Notes (Signed)
Subjective:    Patient ID: Kristopher Bell, male    DOB: 09-28-1976, 39 y.o.   MRN: [id number]  05/28/2015  Annual Exam   HPI This 39 y.o. male presents for Complete Physical Examination.  Last physical: 11-2013 TDAP:  2011 Influenza: refuses Eye exam:  never Dental exam:  Never unless has acute issue.   HTN: Patient reports good compliance with medication, good tolerance to medication, and good symptom control.  Home BP running 110-115/65-75.  Checks at home, twice weekly.    Anxiety/depression/irritability: Patient reports good compliance with medication, good tolerance to medication, and good symptom control.  Took Citalopram for three days only; stopped it.  Work stressors improved.  No more stressors with coworker.  Leg pain: stepped down funny one week ago with acute onset of R lateral pain from ankle to hip; worse if laying supine. If moving around, fine.  When laying down, hurts horribly for 20 minutes.  No n/t/burning.  No weakness.  No b/b dysfunction.  Ibuprofen sporadically; took combo Ibuprofen/Tylenol; can apply biofreeze with improvement.  Occurs with prolonged sitting; similar symptoms several months ago with recurrent symptoms; lasted 3 weeks and then resolved.     Review of Systems  Constitutional: Negative for fever, chills, diaphoresis, activity change, appetite change, fatigue and unexpected weight change.  HENT: Negative for congestion, dental problem, drooling, ear discharge, ear pain, facial swelling, hearing loss, mouth sores, nosebleeds, postnasal drip, rhinorrhea, sinus pressure, sneezing, sore throat, tinnitus, trouble swallowing and voice change.   Eyes: Negative for photophobia, pain, discharge, redness, itching and visual disturbance.  Respiratory: Negative for apnea, cough, choking, chest tightness, shortness of breath, wheezing and stridor.   Cardiovascular: Negative for chest pain, palpitations and leg swelling.  Gastrointestinal: Negative for nausea,  vomiting, abdominal pain, diarrhea, constipation and blood in stool.  Endocrine: Negative for cold intolerance, heat intolerance, polydipsia, polyphagia and polyuria.  Genitourinary: Negative for dysuria, urgency, frequency, hematuria, flank pain, decreased urine volume, discharge, penile swelling, scrotal swelling, enuresis, difficulty urinating, genital sores, penile pain and testicular pain.  Musculoskeletal: Positive for myalgias. Negative for back pain, joint swelling, arthralgias, gait problem, neck pain and neck stiffness.  Skin: Negative for color change, pallor, rash and wound.  Allergic/Immunologic: Negative for environmental allergies, food allergies and immunocompromised state.  Neurological: Negative for dizziness, tremors, seizures, syncope, facial asymmetry, speech difficulty, weakness, light-headedness, numbness and headaches.  Hematological: Negative for adenopathy. Does not bruise/bleed easily.  Psychiatric/Behavioral: Negative for suicidal ideas, hallucinations, behavioral problems, confusion, sleep disturbance, self-injury, dysphoric mood, decreased concentration and agitation. The patient is not nervous/anxious and is not hyperactive.     Past Medical History  Diagnosis Date  . Nephrolithiasis     age 27.  Single episode.  Harmon/Urologist.  . Hypertension 09/29/2005    onset age 59.  Marland Kitchen Toxic effect of venom(989.5)   . Obesity, unspecified   . Tobacco use disorder   . Carpal tunnel syndrome    History reviewed. No pertinent past surgical history. Allergies  Allergen Reactions  . Bee Venom     Yellow jackets    Social History   Social History  . Marital Status: Married    Spouse Name: N/A  . Number of Children: 2  . Years of Education: 12   Occupational History  . 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 Smoking status: Current Every Day Smoker -- 0.50 packs/day for 20 years    Types: Cigarettes  .  Smokeless tobacco: Current User     Types: Snuff  . Alcohol Use: No     Comment: socially; twice yearly.  . Drug Use: No  . Sexual Activity: Yes   Other Topics Concern  . Not on file   Social History Narrative   Marital status: married; married x 15 years. Feels safe at home.     Children: 2 sons (17, 67)      Lives: with wife, 1 son.      Employment:  Body work for [employer] x 21 years.      Exercise: work is physically demanding; no formal exercise program.        Seatbelt:  100% of time.      Guns: 15 guns; unloaded.        Sunscreen:  SPF 50.        Smoke alarm in the home.      Caffeine use:Moderate amount.      Tobacco:  1ppd x 22 years; quit one time for two weeks.      Alcohol: none      Drugs: none   Family History  Problem Relation Age of Onset  . Kidney disease Mother     spastic bladder, hematuria.  . Diverticulitis Mother   . Diabetes Father   . Stroke Father 48  . Hypertension Father 59  . Heart disease Father 65    cardiac stenting; no AMI.  Marland Kitchen Kidney failure Father     HD       Objective:    BP 130/90 mmHg  Pulse 79  Temp(Src) 98.1 F (36.7 C) (Oral)  Resp 16  Ht 6' (1.829 m)  Wt 276 lb 12.8 oz (125.556 kg)  BMI 37.53 kg/m2  SpO2 98% Physical Exam  Constitutional: He is oriented to person, place, and time. He appears well-developed and well-nourished. No distress.  HENT:  Head: Normocephalic and atraumatic.  Right Ear: External ear normal.  Left Ear: External ear normal.  Nose: Nose normal.  Mouth/Throat: Oropharynx is clear and moist.  Eyes: Conjunctivae and EOM are normal. Pupils are equal, round, and reactive to light.  Neck: Normal range of motion. Neck supple. Carotid bruit is not present. No thyromegaly present.  Cardiovascular: Normal rate, regular rhythm, normal heart sounds and intact distal pulses.  Exam reveals no gallop and no friction rub.   No murmur heard. Pulmonary/Chest: Effort normal and breath sounds normal. He has no wheezes. He has no rales.  Abdominal:  Soft. Bowel sounds are normal. He exhibits no distension and no mass. There is no tenderness. There is no rebound and no guarding. Hernia confirmed negative in the right inguinal area and confirmed negative in the left inguinal area.  Genitourinary: Testes normal and penis normal. Right testis shows no mass, no swelling and no tenderness. Left testis shows no mass, no swelling and no tenderness. Circumcised.  Musculoskeletal:       Right shoulder: Normal.       Left shoulder: Normal.       Left hip: He exhibits normal range of motion, normal strength, no tenderness and no bony tenderness.       Left knee: Normal. He exhibits normal range of motion, no swelling, no effusion and no bony tenderness. No tenderness found. No medial joint line, no lateral joint line, no MCL, no LCL and no patellar tendon tenderness noted.       Left ankle: Normal. He exhibits normal range of motion and no swelling. No tenderness.  No lateral malleolus and no medial malleolus tenderness found. Achilles tendon normal.       Cervical back: Normal.       Lumbar back: He exhibits normal range of motion, no tenderness, no bony tenderness, no pain and no spasm.       Left upper leg: He exhibits tenderness. He exhibits no bony tenderness, no swelling, no edema, no deformity and no laceration.       Left lower leg: He exhibits tenderness. He exhibits no bony tenderness, no swelling, no edema, no deformity and no laceration.  +TTP Lateral calf on L; painful range of motion of LLE.  Toe and heel walking intact. Motor 5/5 LLE.  Lymphadenopathy:    He has no cervical adenopathy.       Right: No inguinal adenopathy present.       Left: No inguinal adenopathy present.  Neurological: He is alert and oriented to person, place, and time. He has normal reflexes. No cranial nerve deficit. He exhibits normal muscle tone. Coordination normal.  Skin: Skin is warm and dry. Rash noted. He is not diaphoretic.  Scattered erythema with scaling  diffusely along scalp and facial region at hair distribution.  Nevus above R eyebrow without color variation or border irregularity.  Psychiatric: He has a normal mood and affect. His behavior is normal. Judgment and thought content normal.   EKG: NSR; no acute changes.     Assessment & Plan:   1. Routine physical examination   2. Anxiety and depression   3. Essential hypertension   4. Screening for diabetes mellitus   5. Obesity   6. Bee allergy status   7. Essential hypertension, benign   8. Tobacco abuse   9. BMI 37.0-37.9, adult     1.  Complete Physical Examination: anticipatory guidance --- exercise, weight loss, tobacco cessation.  Immunizations reviewed; pt refused flu vaccine.  Safe driving practices reviewed. 2.  Anxiety and depression: improved with decreased work stressors. 3.  HTN: controlled; obtain labs,u/a, EKG. Refill of Lisinopril 5mg  daily provided; RTC six months. 4.  Screening DMII: obtain glucose, HgbA1c. 5.  Obesity/BMI 37: recommend weight loss, exercise, low-caloric food choices. 6.  Bee sting allergy: refill of Epipen provided. 7. L iliotibial band syndrome: New.  Not suggestive of sciatica; rx for Mobic, Robaxin provided; home exercise program provided.   Meds ordered this encounter  Medications  . DISCONTD: lisinopril (PRINIVIL,ZESTRIL) 5 MG tablet    Sig: Take 1 tablet (5 mg total) by mouth daily.    Dispense:  90 tablet    Refill:  3  . EPINEPHrine (EPIPEN 2-PAK) 0.3 mg/0.3 mL IJ SOAJ injection    Sig: Inject 0.3 mLs (0.3 mg total) into the muscle once.    Dispense:  2 Device    Refill:  1  . meloxicam (MOBIC) 15 MG tablet    Sig: Take 1 tablet (15 mg total) by mouth daily.    Dispense:  30 tablet    Refill:  0  . methocarbamol (ROBAXIN) 500 MG tablet    Sig: Take 1-2 tablets (500-1,000 mg total) by mouth every 8 (eight) hours as needed for muscle spasms.    Dispense:  60 tablet    Refill:  0  . lisinopril (PRINIVIL,ZESTRIL) 5 MG tablet     Sig: Take 1 tablet (5 mg total) by mouth daily.    Dispense:  90 tablet    Refill:  3    Return in about 6 months (around 11/27/2015) for  recheck hypertension.    Ahmet Schank Paulita Fujita, M.D. [facility] [address] [place], [place]  [address] [phone] phone [phone] fax

## 2015-05-28 NOTE — Patient Instructions (Addendum)
Keeping you healthy  Get these tests  Blood pressure- Have your blood pressure checked once a year by your healthcare provider.  Normal blood pressure is 120/80.  Weight- Have your body mass index (BMI) calculated to screen for obesity.  BMI is a measure of body fat based on height and weight. You can also calculate your own BMI at https://www.west-esparza.com/.  Cholesterol- Have your cholesterol checked regularly starting at age 39, sooner may be necessary if you have diabetes, high blood pressure, if a family member developed heart diseases at an early age or if you smoke.   Chlamydia, HIV, and other sexual transmitted disease- Get screened each year until the age of 58 then within three months of each new sexual partner.  Diabetes- Have your blood sugar checked regularly if you have high blood pressure, high cholesterol, a family history of diabetes or if you are overweight.  Get these vaccines  Flu shot- Every fall.  Tetanus shot- Every 10 years.  Menactra- Single dose; prevents meningitis.  Take these steps  Don't smoke- If you do smoke, ask your healthcare provider about quitting. For tips on how to quit, go to www.smokefree.gov or call 1-800-QUIT-NOW.  Be physically active- Exercise 5 days a week for at least 30 minutes.  If you are not already physically active start slow and gradually work up to 30 minutes of moderate physical activity.  Examples of moderate activity include walking briskly, mowing the yard, dancing, swimming bicycling, etc.  Eat a healthy diet- Eat a variety of healthy foods such as fruits, vegetables, low fat milk, low fat cheese, yogurt, lean meats, poultry, fish, beans, tofu, etc.  For more information on healthy eating, go to www.thenutritionsource.org  Drink alcohol in moderation- Limit alcohol intake two drinks or less a day.  Never drink and drive.  Dentist- Brush and floss teeth twice daily; visit your dentis twice a year.  Depression-Your emotional  health is as important as your physical health.  If you're feeling down, losing interest in things you normally enjoy please talk with your healthcare provider.  Gun Safety- If you keep a gun in your home, keep it unloaded and with the safety lock on.  Bullets should be stored separately.  Helmet use- Always wear a helmet when riding a motorcycle, bicycle, rollerblading or skateboarding.  Safe sex- If you may be exposed to a sexually transmitted infection, use a condom  Seat belts- Seat bels can save your life; always wear one.  Smoke/Carbon Monoxide detectors- These detectors need to be installed on the appropriate level of your home.  Replace batteries at least once a year.  Skin Cancer- When out in the sun, cover up and use sunscreen SPF 15 or higher. Violence- If anyone is threatening or hurting you, please tell your healthcare provider.  Iliotibial Band Syndrome with Rehab The iliotibial (IT) band is a tendon that connects the hip muscles to the shinbone (tibia) and to one of the bones of the pelvis (ileum). The IT band passes by the knee and is often irritated by the outer portion of the knee (lateral femoral condyle). A fluid filled sac (bursa) exists between the tendon and the bone, to cushion and reduce friction. Overuse of the tendon may cause excessive friction, which results in IT band syndrome. This condition involves inflammation of the bursa (bursitis) and/or inflammation of the IT band (tendinitis). SYMPTOMS  Pain, tenderness, swelling, warmth, or redness over the IT band, at the outer knee (above the joint). Pain that travels  up or down the thigh or leg. Initially, pain at the beginning of an exercise, that decreases once warmed up. Eventually, pain throughout the activity, getting worse as the activity continues. May cause the athlete to stop in the middle of training or competing. Pain that gets worse when running down hills or stairs, on banked tracks, or next to the curb on  the street. Pain that increases when the foot of the affected leg hits the ground. Possibly, a crackling sound (crepitation) when the tendon or bursa is moved or touched. CAUSES  IT band syndrome is caused by irritation of the IT band and the underlying bursa. This eventually results in inflammation and pain. IT band syndrome is an overuse injury.  RISK INCREASES WITH: Sports with repetitive knee-bending activities (distance running, cycling). Incorrect training techniques, including sudden changes in the intensity, frequency, or duration of training. Not enough rest between workouts. Poor strength and flexibility, especially a tight IT band. Failure to warm up properly before activity. Bow legs. Arthritis of the knee. PREVENTION  Warm up and stretch properly before activity. Allow for adequate recovery between workouts. Maintain physical fitness: Strength, flexibility, and endurance. Cardiovascular fitness. Learn and use proper training technique, including reducing running mileage, shortening stride, and avoiding running on hills and banked surfaces. Wear arch supports (orthotics), if you have flat feet. PROGNOSIS  If treated properly, IT band syndrome usually goes away within 6 weeks of treatment. RELATED COMPLICATIONS  Longer healing time, if not properly treated, or if not given enough time to heal. Recurring inflammation of the tendon and bursa, that may result in a chronic condition. Recurring symptoms, if activity is resumed too soon, with overuse, with a direct blow, or with poor training technique. Inability to complete training or competition. TREATMENT  Treatment first involves the use of ice and medicine, to reduce pain and inflammation. The use of strengthening and stretching exercises may help reduce pain with activity. These exercises may be performed at home or with a therapist. For individuals with flat feet, an arch support (orthotic) may be helpful. Some individuals  find that wearing a knee sleeve or compression bandage around the knee during workouts provides some relief. Certain training techniques, such as adjusting stride length, avoiding running on hills or stairs, changing the direction you run on a circular or banked track, or changing the side of the road you run on, if you run next to the curb, may help decrease symptoms of IT band syndrome. Cyclists may need to change the seat height or foot position on their bicycles. An injection of cortisone into the bursa may be recommended. Surgery to remove the inflamed bursa and/or part of the IT band is only considered after at least 6 months of non-surgical treatment.  MEDICATION  If pain medicine is needed, nonsteroidal anti-inflammatory medicines (aspirin and ibuprofen), or other minor pain relievers (acetaminophen), are often advised. Do not take pain medicine for 7 days before surgery. Prescription pain relievers may be given, if your caregiver thinks they are needed. Use only as directed and only as much as you need. Corticosteroid injections may be given by your caregiver. These injections should be reserved for the most serious cases, because they may only be given a certain number of times. HEAT AND COLD Cold treatment (icing) should be applied for 10 to 15 minutes every 2 to 3 hours for inflammation and pain, and immediately after activity that aggravates your symptoms. Use ice packs or an ice massage. Heat treatment may be  used before performing stretching and strengthening activities prescribed by your caregiver, physical therapist, or athletic trainer. Use a heat pack or a warm water soak. SEEK MEDICAL CARE IF:  Symptoms get worse or do not improve in 2 to 4 weeks, despite treatment. New, unexplained symptoms develop. (Drugs used in treatment may produce side effects.) EXERCISES  RANGE OF MOTION (ROM) AND STRETCHING EXERCISES - Iliotibial Band Syndrome These exercises may help you when beginning to  rehabilitate your injury. Your symptoms may go away with or without further involvement from your physician, physical therapist or athletic trainer. While completing these exercises, remember:  Restoring tissue flexibility helps normal motion to return to the joints. This allows healthier, less painful movement and activity. An effective stretch should be held for at least 30 seconds. A stretch should never be painful. You should only feel a gentle lengthening or release in the stretched tissue. STRETCH - Quadriceps, Prone  Lie on your stomach on a firm surface, such as a bed or padded floor. Bend your right / left knee and grasp your ankle. If you are unable to reach your ankle or pant leg, use a belt around your foot to lengthen your reach. Gently pull your heel toward your buttocks. Your knee should not slide out to the side. You should feel a stretch in the front of your thigh and knee. Hold this position for __________ seconds. Repeat __________ times. Complete this stretch __________ times per day.  STRETCH - Iliotibial Band On the floor or bed, lie on your side, so your right / left leg is on top. Bend your knee and grab your ankle. Slowly bring your knee back so that your thigh is in line with your trunk. Keep your heel at your buttocks and gently arch your back, so your head, shoulders and hips line up. Slowly lower your leg so that your knee approaches the floor or bed, until you feel a gentle stretch on the outside of your right / left thigh. If you do not feel a stretch and your knee will not fall farther, place the heel of your opposite foot on top of your knee, and pull your thigh down farther. Hold this stretch for __________ seconds. Repeat __________ times. Complete this stretch __________ times per day. STRENGTHENING EXERCISES - Iliotibial Band Syndrome Improving the flexibility of the IT band will best relieve your discomfort due to IT band syndrome. Strengthening exercises,  however, can help improve both muscle endurance and joint mechanics, reducing the factors that can contribute to this condition. Your physician, physical therapist or athletic trainer may provide you with exercises that train specific muscle groups that are especially weak. The following exercises target muscles that are often weak in people who have IT band syndrome. STRENGTH - Hip Abductors, Straight Leg Raises  Be aware of your form throughout the entire exercise, so that you exercise the correct muscles. Poor form means that you are not strengthening the correct muscles. Lie on your side, so that your head, shoulders, knee and hip line up. You may bend your lower knee to help maintain your balance. Your right / left leg should be on top. Roll your hips slightly forward, so that your hips are stacked directly over each other and your right / left knee is facing forward. Lift your top leg up 4-6 inches, leading with your heel. Be sure that your foot does not drift forward and that your knee does not roll toward the ceiling. Hold this position for __________  seconds. You should feel the muscles in your outer hip lifting (you may not notice this until your leg begins to tire). Slowly lower your leg to the starting position. Allow the muscles to fully relax before beginning the next repetition. Repeat __________ times. Complete this exercise __________ times per day.  STRENGTH - Quad/VMO, Isometric Sit in a chair with your right / left knee slightly bent. With your fingertips, feel the VMO muscle (just above the inside of your knee). The VMO is important in controlling the position of your kneecap. Keeping your fingertips on this muscle. Without actually moving your leg, attempt to drive your knee down, as if straightening your leg. You should feel your VMO tense. If you have a difficult time, you may wish to try the same exercise on your healthy knee first. Tense this muscle as hard as you can, without  increasing any knee pain. Hold for __________ seconds. Relax the muscles slowly and completely between each repetition. Repeat __________ times. Complete this exercise __________ times per day.  Document Released: 09/15/2005 Document Revised: 12/08/2011 Document Reviewed: 12/28/2008 [facility] Patient Information 2015 [place], [place]. This information is not intended to replace advice given to you by your health care provider. Make sure you discuss any questions you have with your health care provider.

## 2015-05-29 LAB — VITAMIN D 25 HYDROXY (VIT D DEFICIENCY, FRACTURES): Vit D, 25-Hydroxy: 21 ng/mL — ABNORMAL LOW (ref 30–100)

## 2015-11-30 ENCOUNTER — Ambulatory Visit: Payer: PRIVATE HEALTH INSURANCE | Admitting: Family Medicine

## 2015-12-18 ENCOUNTER — Ambulatory Visit (INDEPENDENT_AMBULATORY_CARE_PROVIDER_SITE_OTHER): Payer: PRIVATE HEALTH INSURANCE | Admitting: Family Medicine

## 2015-12-18 ENCOUNTER — Encounter: Payer: Self-pay | Admitting: Family Medicine

## 2015-12-18 VITALS — BP 138/89 | HR 89 | Temp 98.4°F | Resp 16 | Wt 271.2 lb

## 2015-12-18 DIAGNOSIS — Z114 Encounter for screening for human immunodeficiency virus [HIV]: Secondary | ICD-10-CM

## 2015-12-18 DIAGNOSIS — H029 Unspecified disorder of eyelid: Secondary | ICD-10-CM

## 2015-12-18 DIAGNOSIS — I10 Essential (primary) hypertension: Secondary | ICD-10-CM | POA: Diagnosis not present

## 2015-12-18 DIAGNOSIS — N5201 Erectile dysfunction due to arterial insufficiency: Secondary | ICD-10-CM | POA: Diagnosis not present

## 2015-12-18 DIAGNOSIS — E785 Hyperlipidemia, unspecified: Secondary | ICD-10-CM | POA: Diagnosis not present

## 2015-12-18 DIAGNOSIS — E559 Vitamin D deficiency, unspecified: Secondary | ICD-10-CM

## 2015-12-18 LAB — CBC WITH DIFFERENTIAL/PLATELET
BASOS PCT: 1 % (ref 0–1)
Basophils Absolute: 0.1 10*3/uL (ref 0.0–0.1)
EOS ABS: 0.2 10*3/uL (ref 0.0–0.7)
Eosinophils Relative: 3 % (ref 0–5)
HCT: 50.8 % (ref 39.0–52.0)
HEMOGLOBIN: 17.2 g/dL — AB (ref 13.0–17.0)
LYMPHS PCT: 21 % (ref 12–46)
Lymphs Abs: 1.7 10*3/uL (ref 0.7–4.0)
MCH: 30.4 pg (ref 26.0–34.0)
MCHC: 33.9 g/dL (ref 30.0–36.0)
MCV: 89.8 fL (ref 78.0–100.0)
MONO ABS: 0.6 10*3/uL (ref 0.1–1.0)
MONOS PCT: 8 % (ref 3–12)
MPV: 9.6 fL (ref 8.6–12.4)
NEUTROS PCT: 67 % (ref 43–77)
Neutro Abs: 5.4 10*3/uL (ref 1.7–7.7)
PLATELETS: 213 10*3/uL (ref 150–400)
RBC: 5.66 MIL/uL (ref 4.22–5.81)
RDW: 13.7 % (ref 11.5–15.5)
WBC: 8 10*3/uL (ref 4.0–10.5)

## 2015-12-18 LAB — COMPREHENSIVE METABOLIC PANEL
ALBUMIN: 4.1 g/dL (ref 3.6–5.1)
ALT: 21 U/L (ref 9–46)
AST: 14 U/L (ref 10–40)
Alkaline Phosphatase: 72 U/L (ref 40–115)
BUN: 11 mg/dL (ref 7–25)
CHLORIDE: 104 mmol/L (ref 98–110)
CO2: 28 mmol/L (ref 20–31)
CREATININE: 0.58 mg/dL — AB (ref 0.60–1.35)
Calcium: 8.9 mg/dL (ref 8.6–10.3)
Glucose, Bld: 89 mg/dL (ref 65–99)
POTASSIUM: 4.7 mmol/L (ref 3.5–5.3)
SODIUM: 141 mmol/L (ref 135–146)
Total Bilirubin: 0.4 mg/dL (ref 0.2–1.2)
Total Protein: 6.2 g/dL (ref 6.1–8.1)

## 2015-12-18 LAB — LIPID PANEL
CHOL/HDL RATIO: 4.9 ratio (ref <=5.0)
Cholesterol: 177 mg/dL (ref 125–200)
HDL: 36 mg/dL — ABNORMAL LOW (ref >=40)
LDL CALC: 114 mg/dL (ref <130)
TRIGLYCERIDES: 133 mg/dL (ref <150)
VLDL: 27 mg/dL (ref <30)

## 2015-12-18 LAB — VITAMIN D 25 HYDROXY (VIT D DEFICIENCY, FRACTURES): Vit D, 25-Hydroxy: 29 ng/mL — ABNORMAL LOW (ref 30–100)

## 2015-12-18 LAB — TESTOSTERONE: TESTOSTERONE: 252 ng/dL (ref 250–827)

## 2015-12-18 LAB — HIV ANTIBODY (ROUTINE TESTING W REFLEX): HIV 1&2 Ab, 4th Generation: NONREACTIVE

## 2015-12-18 MED ORDER — SILDENAFIL CITRATE 20 MG PO TABS: 20.0000 mg | ORAL_TABLET | Freq: Every day | ORAL | Status: DC | PRN

## 2015-12-18 MED ORDER — LISINOPRIL 5 MG PO TABS: 5.0000 mg | ORAL_TABLET | Freq: Every day | ORAL | Status: DC

## 2015-12-18 NOTE — Patient Instructions (Signed)
     IF you received an x-ray today, you will receive an invoice from [facility]. Please contact [facility] at [phone] with questions or concerns regarding your invoice.   IF you received labwork today, you will receive an invoice from [facility]. Please contact [facility] at [phone] with questions or concerns regarding your invoice.   Our billing staff will not be able to assist you with questions regarding bills from these companies.  You will be contacted with the lab results as soon as they are available. The fastest way to get your results is to activate your My Chart account. Instructions are located on the last page of this paperwork. If you have not heard from us regarding the results in 2 weeks, please contact this office.      

## 2015-12-18 NOTE — Progress Notes (Signed)
Subjective:    Patient ID: Kristopher Bell, male    DOB: 01-08-1976, 40 y.o.   MRN: [id number]  12/18/2015  Hypertension   HPI This 40 y.o. male presents for six month follow-up:  1. HTN: Patient reports good compliance with medication, good tolerance to medication, and good symptom control.  Home BP running 130/65.  BP higher in the morning upon awakening; checking later in the day BP 117/60.    2.  Stress: major stressors going on; family stressors; work stressors; car stressors; overwhelmed.  Going to bed early.  Doing well.    3. Vitamin D deficiency: at CPE six months.  Taking vitamin D daily 2000 IU.    4.  Dyslipidemia: triglycerides 228; HDL 29 LDL 82.  Rare fried foods.   No fish oils.    5. Difficulties maintaining erection: for a while.  Not all of the time.  Rare morning erections which is chronic.  Able to get erection.  Review of Systems  Constitutional: Negative for fever, chills, diaphoresis, activity change, appetite change and fatigue.  Respiratory: Negative for cough and shortness of breath.   Cardiovascular: Negative for chest pain, palpitations and leg swelling.  Gastrointestinal: Negative for nausea, vomiting, abdominal pain and diarrhea.  Endocrine: Negative for cold intolerance, heat intolerance, polydipsia, polyphagia and polyuria.  Skin: Negative for color change, rash and wound.  Neurological: Negative for dizziness, tremors, seizures, syncope, facial asymmetry, speech difficulty, weakness, light-headedness, numbness and headaches.  Psychiatric/Behavioral: Negative for sleep disturbance and dysphoric mood. The patient is not nervous/anxious.     Past Medical History  Diagnosis Date  . Nephrolithiasis     age 36.  Single episode.  Harmon/Urologist.  . Hypertension 09/29/2005    onset age 60.  Marland Kitchen Toxic effect of venom(989.5)   . Obesity, unspecified   . Tobacco use disorder   . Carpal tunnel syndrome    History reviewed. No pertinent past surgical  history. Allergies  Allergen Reactions  . Bee Venom     Yellow jackets    Social History   Social History  . Marital Status: Married    Spouse Name: N/A  . Number of Children: 2  . Years of Education: 12   Occupational History  . 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 Smoking status: Current Every Day Smoker -- 0.50 packs/day for 20 years    Types: Cigarettes  . Smokeless tobacco: Current User    Types: Snuff  . Alcohol Use: No     Comment: socially; twice yearly.  . Drug Use: No  . Sexual Activity: Yes   Other Topics Concern  . Not on file   Social History Narrative   Marital status: married; married x 15 years. Feels safe at home.     Children: 2 sons (17, 54)      Lives: with wife, 1 son.      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      Exercise: work is physically demanding; no formal exercise program.        Seatbelt:  100% of time.      Guns: 15 guns; unloaded.        Sunscreen:  SPF 66.        Smoke alarm in the home.      Caffeine use:Moderate amount.      Tobacco:  1ppd x 22 years; quit one time for two weeks.  Alcohol: none      Drugs: none   Family History  Problem Relation Age of Onset  . Kidney disease Mother     spastic bladder, hematuria.  . Diverticulitis Mother   . Diabetes Father   . Stroke Father 92  . Hypertension Father 24  . Heart disease Father 42    cardiac stenting; no AMI.  Marland Kitchen Kidney failure Father     HD       Objective:    BP 138/89 mmHg  Pulse 89  Temp(Src) 98.4 F (36.9 C) (Oral)  Resp 16  Wt 271 lb 3.2 oz (123.016 kg) Physical Exam  Constitutional: He is oriented to person, place, and time. He appears well-developed and well-nourished. No distress.  HENT:  Head: Normocephalic and atraumatic.  Right Ear: External ear normal.  Left Ear: External ear normal.  Nose: Nose normal.  Mouth/Throat: Oropharynx is clear and moist.  Eyes: Conjunctivae and EOM are normal. Pupils are equal,  round, and reactive to light.  Neck: Normal range of motion. Neck supple. Carotid bruit is not present. No thyromegaly present.  Cardiovascular: Normal rate, regular rhythm, normal heart sounds and intact distal pulses.  Exam reveals no gallop and no friction rub.   No murmur heard. Pulmonary/Chest: Effort normal and breath sounds normal. He has no wheezes. He has no rales.  Abdominal: Soft. Bowel sounds are normal. He exhibits no distension and no mass. There is no tenderness. There is no rebound and no guarding.  Lymphadenopathy:    He has no cervical adenopathy.  Neurological: He is alert and oriented to person, place, and time. No cranial nerve deficit.  Skin: Skin is warm and dry. No rash noted. He is not diaphoretic.  Psychiatric: He has a normal mood and affect. His behavior is normal.  Nursing note and vitals reviewed.  Results for orders placed or performed in visit on 05/28/15  CBC with Differential/Platelet  Result Value Ref Range   WBC 8.8 4.0 - 10.5 K/uL   RBC 5.50 4.22 - 5.81 MIL/uL   Hemoglobin 16.6 13.0 - 17.0 g/dL   HCT 48.4 39.0 - 52.0 %   MCV 88.0 78.0 - 100.0 fL   MCH 30.2 26.0 - 34.0 pg   MCHC 34.3 30.0 - 36.0 g/dL   RDW 14.1 11.5 - 15.5 %   Platelets 166 150 - 400 K/uL   MPV 10.1 8.6 - 12.4 fL   Neutrophils Relative % 69 43 - 77 %   Neutro Abs 6.1 1.7 - 7.7 K/uL   Lymphocytes Relative 22 12 - 46 %   Lymphs Abs 1.9 0.7 - 4.0 K/uL   Monocytes Relative 6 3 - 12 %   Monocytes Absolute 0.5 0.1 - 1.0 K/uL   Eosinophils Relative 3 0 - 5 %   Eosinophils Absolute 0.3 0.0 - 0.7 K/uL   Basophils Relative 0 0 - 1 %   Basophils Absolute 0.0 0.0 - 0.1 K/uL   Smear Review Criteria for review not met   Comprehensive metabolic panel  Result Value Ref Range   Sodium 142 135 - 146 mmol/L   Potassium 4.9 3.5 - 5.3 mmol/L   Chloride 105 98 - 110 mmol/L   CO2 26 20 - 31 mmol/L   Glucose, Bld 86 65 - 99 mg/dL   BUN 13 7 - 25 mg/dL   Creat 0.68 0.60 - 1.35 mg/dL   Total  Bilirubin 0.4 0.2 - 1.2 mg/dL   Alkaline Phosphatase 66 40 - 115  U/L   AST 12 10 - 40 U/L   ALT 21 9 - 46 U/L   Total Protein 6.2 6.1 - 8.1 g/dL   Albumin 4.3 3.6 - 5.1 g/dL   Calcium 8.7 8.6 - 10.3 mg/dL  Hemoglobin A1c  Result Value Ref Range   Hgb A1c MFr Bld 5.3 <5.7 %   Mean Plasma Glucose 105 <117 mg/dL  Lipid panel  Result Value Ref Range   Cholesterol 157 125 - 200 mg/dL   Triglycerides 228 (H) <150 mg/dL   HDL 29 (L) >=40 mg/dL   Total CHOL/HDL Ratio 5.4 (H) <=5.0 Ratio   VLDL 46 (H) <30 mg/dL   LDL Cholesterol 82 <130 mg/dL  TSH  Result Value Ref Range   TSH 2.673 0.350 - 4.500 uIU/mL  Vit D  25 hydroxy (rtn osteoporosis monitoring)  Result Value Ref Range   Vit D, 25-Hydroxy 21 (L) 30 - 100 ng/mL  Vitamin B12  Result Value Ref Range   Vitamin B-12 721 211 - 911 pg/mL  POCT urinalysis dipstick  Result Value Ref Range   Color, UA Amber    Clarity, UA Clear    Glucose, UA Negative    Bilirubin, UA Negative    Ketones, UA NEgative    Spec Grav, UA 1.025    Blood, UA Negative    pH, UA 5.0    Protein, UA Negative    Urobilinogen, UA 0.2    Nitrite, UA negative    Leukocytes, UA Negative Negative       Assessment & Plan:   1. Essential hypertension, benign   2. Dyslipidemia   3. Vitamin D deficiency   4. Screening for HIV (human immunodeficiency virus)   5. Eyelid lesion   6. Erectile dysfunction due to arterial insufficiency     1. HTN: controlled; obtain labs; continue current medications. 2.  Dyslipidemia: uncontrolled; obtain labs; dietary modification; consider addition of fish oil. 3.  Vitamin D deficiency:  Uncontrolled; repeat labs.  Continue Vitamin D 1000 IU daily. 4.  Screening HIV; obtain labs. 5.  Eyelid lesion L upper: New. Recommend dermatology evaluation for resection; concerning for malignancy. 6.  ED:  New; able to have erection; difficulties maintaining erection.  Rx for Revatio 66m 1-2 prior to intercourse; side effects reviewed in  detail.   Orders Placed This Encounter  Procedures  . CBC with Differential/Platelet  . Comprehensive metabolic panel    Order Specific Question:  Has the patient fasted?    Answer:  Yes  . Lipid panel    Order Specific Question:  Has the patient fasted?    Answer:  Yes  . VITAMIN D 25 Hydroxy (Vit-D Deficiency, Fractures)  . HIV antibody  . Testosterone   Meds ordered this encounter  Medications  . lisinopril (PRINIVIL,ZESTRIL) 5 MG tablet    Sig: Take 1 tablet (5 mg total) by mouth daily.    Dispense:  90 tablet    Refill:  3  . sildenafil (REVATIO) 20 MG tablet    Sig: Take 1-2 tablets (20-40 mg total) by mouth daily as needed.    Dispense:  90 tablet    Refill:  0    Return in about 6 months (around 06/19/2016) for complete physical examiniation.    Kristi MElayne Guerin M.D. [facility] [address] CourtGCatherine Hawthorne  [phone]

## 2015-12-19 NOTE — Addendum Note (Signed)
Addended by: Lucia GaskinsHAAS, Hagop Mccollam C on: 12/19/2015 07:51 AM   Modules accepted: Kipp BroodSmartSet

## 2016-01-08 ENCOUNTER — Encounter: Payer: Self-pay | Admitting: Family Medicine

## 2016-01-16 ENCOUNTER — Other Ambulatory Visit: Payer: Self-pay | Admitting: Family Medicine

## 2016-01-17 NOTE — Telephone Encounter (Signed)
Dr Katrinka BlazingSmith, you saw pt last month for 6 mos check up but don't see L iliotibial band syndrome discussed for which you Rxd this last Aug. RFs?

## 2016-07-15 ENCOUNTER — Other Ambulatory Visit: Payer: Self-pay | Admitting: Family Medicine

## 2016-07-22 ENCOUNTER — Encounter: Payer: PRIVATE HEALTH INSURANCE | Admitting: Family Medicine

## 2016-12-16 ENCOUNTER — Ambulatory Visit (INDEPENDENT_AMBULATORY_CARE_PROVIDER_SITE_OTHER): Payer: PRIVATE HEALTH INSURANCE | Admitting: Family Medicine

## 2016-12-16 ENCOUNTER — Encounter: Payer: Self-pay | Admitting: Family Medicine

## 2016-12-16 VITALS — BP 137/84 | HR 83 | Temp 98.6°F | Resp 16 | Ht 72.0 in | Wt 277.4 lb

## 2016-12-16 DIAGNOSIS — Z720 Tobacco use: Secondary | ICD-10-CM

## 2016-12-16 DIAGNOSIS — Z6837 Body mass index (BMI) 37.0-37.9, adult: Secondary | ICD-10-CM | POA: Diagnosis not present

## 2016-12-16 DIAGNOSIS — Z8249 Family history of ischemic heart disease and other diseases of the circulatory system: Secondary | ICD-10-CM

## 2016-12-16 DIAGNOSIS — Z91030 Bee allergy status: Secondary | ICD-10-CM | POA: Diagnosis not present

## 2016-12-16 DIAGNOSIS — IMO0001 Reserved for inherently not codable concepts without codable children: Secondary | ICD-10-CM

## 2016-12-16 DIAGNOSIS — E041 Nontoxic single thyroid nodule: Secondary | ICD-10-CM

## 2016-12-16 DIAGNOSIS — N5203 Combined arterial insufficiency and corporo-venous occlusive erectile dysfunction: Secondary | ICD-10-CM | POA: Diagnosis not present

## 2016-12-16 DIAGNOSIS — I10 Essential (primary) hypertension: Secondary | ICD-10-CM

## 2016-12-16 DIAGNOSIS — F411 Generalized anxiety disorder: Secondary | ICD-10-CM | POA: Diagnosis not present

## 2016-12-16 DIAGNOSIS — Z0000 Encounter for general adult medical examination without abnormal findings: Secondary | ICD-10-CM

## 2016-12-16 DIAGNOSIS — Z125 Encounter for screening for malignant neoplasm of prostate: Secondary | ICD-10-CM | POA: Diagnosis not present

## 2016-12-16 DIAGNOSIS — E6609 Other obesity due to excess calories: Secondary | ICD-10-CM | POA: Diagnosis not present

## 2016-12-16 LAB — POCT URINALYSIS DIP (MANUAL ENTRY)
Bilirubin, UA: NEGATIVE
Glucose, UA: NEGATIVE
Ketones, POC UA: NEGATIVE
Leukocytes, UA: NEGATIVE
NITRITE UA: NEGATIVE
Protein Ur, POC: NEGATIVE
Spec Grav, UA: 1.02 (ref 1.030–1.035)
UROBILINOGEN UA: 1 (ref ?–2.0)
pH, UA: 6 (ref 5.0–8.0)

## 2016-12-16 MED ORDER — LISINOPRIL 5 MG PO TABS: 5.0000 mg | ORAL_TABLET | Freq: Every day | ORAL | 3 refills | Status: DC

## 2016-12-16 MED ORDER — EPINEPHRINE 0.3 MG/0.3ML IJ SOAJ: 0.3000 mg | Freq: Once | INTRAMUSCULAR | 1 refills | Status: AC

## 2016-12-16 MED ORDER — SILDENAFIL CITRATE 20 MG PO TABS: ORAL_TABLET | ORAL | 3 refills | Status: AC

## 2016-12-16 NOTE — Progress Notes (Signed)
Subjective:    Patient ID: Kristopher Bell, male    DOB: [birth year], 41 y.o.   MRN: [id number]  12/16/2016  Annual Exam and Medication Refill (lisinopril)   HPI This 41 y.o. male presents for Complete Physical Examination.  Last physical:  05-28-15 Eye exam:  Never Dental exam:  Two years ago.  Immunization History  Administered Date(s) Administered  . Td 09/29/2009   BP Readings from Last 3 Encounters:  12/16/16 137/84  12/18/15 138/89  05/28/15 130/90   Wt Readings from Last 3 Encounters:  12/16/16 277 lb 6.4 oz (125.8 kg)  12/18/15 271 lb 3.2 oz (123 kg)  05/28/15 276 lb 12.8 oz (125.6 kg)   HTN: Patient reports good compliance with medication, good tolerance to medication, and good symptom control.   Home BP running better than office readings.  ED: Sildenafil 20mg  daily.  Usually takes 2 tablets.  Able to get an erection; maintaining erections an issue.  Sex drive is good.   Review of Systems  Constitutional: Negative for activity change, appetite change, chills, diaphoresis, fatigue, fever and unexpected weight change.  HENT: Negative for congestion, dental problem, drooling, ear discharge, ear pain, facial swelling, hearing loss, mouth sores, nosebleeds, postnasal drip, rhinorrhea, sinus pressure, sneezing, sore throat, tinnitus, trouble swallowing and voice change.   Eyes: Negative for photophobia, pain, discharge, redness, itching and visual disturbance.  Respiratory: Negative for apnea, cough, choking, chest tightness, shortness of breath, wheezing and stridor.   Cardiovascular: Negative for chest pain, palpitations and leg swelling.  Gastrointestinal: Negative for abdominal pain, blood in stool, constipation, diarrhea, nausea and vomiting.  Endocrine: Negative for cold intolerance, heat intolerance, polydipsia, polyphagia and polyuria.  Genitourinary: Negative for decreased urine volume, difficulty urinating, discharge, dysuria, enuresis, flank pain, frequency,  genital sores, hematuria, penile pain, penile swelling, scrotal swelling, testicular pain and urgency.       +cannot maintain erections  Musculoskeletal: Positive for arthralgias. Negative for back pain, gait problem, joint swelling, myalgias, neck pain and neck stiffness.       L knee pain  Skin: Negative for color change, pallor, rash and wound.  Allergic/Immunologic: Negative for environmental allergies, food allergies and immunocompromised state.  Neurological: Negative for dizziness, tremors, seizures, syncope, facial asymmetry, speech difficulty, weakness, light-headedness, numbness and headaches.  Hematological: Negative for adenopathy. Does not bruise/bleed easily.  Psychiatric/Behavioral: Negative for agitation, behavioral problems, confusion, decreased concentration, dysphoric mood, hallucinations, self-injury, sleep disturbance and suicidal ideas. The patient is not nervous/anxious and is not hyperactive.     Past Medical History:  Diagnosis Date  . Hypertension 09/29/2005   onset age 71.  . Nephrolithiasis    age 63.  Single episode.  Harmon/Urologist.  . Obesity, unspecified   . Tobacco use disorder   . 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 No pertinent surgical history. Allergies  Allergen Reactions  . Bee Venom     Yellow jackets   Current Outpatient Prescriptions  Medication Sig Dispense Refill  . EPINEPHrine (EPIPEN 2-PAK) 0.3 mg/0.3 mL IJ SOAJ injection Inject 0.3 mLs (0.3 mg total) into the muscle once. 2 Device 1  . lisinopril (PRINIVIL,ZESTRIL) 5 MG tablet Take 1 tablet (5 mg total) by mouth daily. 90 tablet 3  . meloxicam (MOBIC) 15 MG tablet TAKE (1) TABLET BY MOUTH EVERY DAY 90 tablet 0  . methocarbamol (ROBAXIN) 500 MG tablet TAKE 1-2 TABLETS BY MOUTH EVERY 8 HOURS AS NEEDED FOR MUSCLE SPASMS 90 tablet 0  . sildenafil (REVATIO) 20 MG tablet  TAKE 1-2 TABLETS BY MOUTH DAILY AS NEEDED AS DIRECTED BY PHYSICIAN. 90 tablet 3   No current  facility-administered medications for this visit.    Social History   Social History  . Marital status: Married    Spouse name: N/A  . Number of children: 2  . Years of education: 12   Occupational History  . 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 Smoking status: Current Every Day Smoker    Packs/day: 0.50    Years: 20.00    Types: Cigarettes  . Smokeless tobacco: Current User    Types: Snuff, Chew  . Alcohol use No     Comment: socially; twice yearly.  . Drug use: No  . Sexual activity: Yes     Comment: wife BTL.   Other Topics Concern  . Not on file   Social History Narrative   Marital status:  married x 17 years. Feels safe at home.     Children: 2 sons (19, 32)      Lives: with wife, 1 son.      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      Exercise: work is physically demanding; no formal exercise program.        Seatbelt:  100% of time. No texting.      Guns: 15 guns; unloaded.        Sunscreen:  SPF 50.        Smoke alarm in the home.      Caffeine use:Moderate amount.      Tobacco:  1ppd x 22 years; quit one time for two weeks.      Alcohol: none      Drugs: none   Family History  Problem Relation Age of Onset  . Kidney disease Mother     spastic bladder, hematuria.  . Diverticulitis Mother   . Diabetes Father   . Stroke Father 57  . Hypertension Father 63  . Heart disease Father 52    cardiac stenting; no AMI.  Marland Kitchen Kidney failure Father     HD       Objective:    BP 137/84 (BP Location: Right Arm, Patient Position: Sitting, Cuff Size: Large)   Pulse 83   Temp 98.6 F (37 C) (Oral)   Resp 16   Ht 6' (1.829 m)   Wt 277 lb 6.4 oz (125.8 kg)   SpO2 95%   BMI 37.62 kg/m  Physical Exam  Constitutional: He is oriented to person, place, and time. He appears well-developed and well-nourished. No distress.  HENT:  Head: Normocephalic and atraumatic.  Right Ear: External ear normal.  Left  Ear: External ear normal.  Nose: Nose normal.  Mouth/Throat: Oropharynx is clear and moist.  Eyes: Conjunctivae and EOM are normal. Pupils are equal, round, and reactive to light.  Neck: Normal range of motion. Neck supple. Carotid bruit is not present. No thyromegaly present.  Cardiovascular: Normal rate, regular rhythm, normal heart sounds and intact distal pulses.  Exam reveals no gallop and no friction rub.   No murmur heard. Pulmonary/Chest: Effort normal and breath sounds normal. He has no wheezes. He has no rales.  Abdominal: Soft. Bowel sounds are normal. He exhibits no distension and no mass. There is no tenderness. There is no rebound and no guarding. Hernia confirmed negative in the right inguinal area and confirmed negative in the left inguinal area.  Genitourinary: Testes normal and penis normal. Circumcised.  Lymphadenopathy:    He has no cervical adenopathy.       Right: No inguinal adenopathy present.       Left: No inguinal adenopathy present.  Neurological: He is alert and oriented to person, place, and time. No cranial nerve deficit.  Skin: Skin is warm and dry. No rash noted. He is not diaphoretic.  Warty growth upper lid on L.  Scattered warty growths B areola.  Scattered nevi on torso without atypia or color variation.   Psychiatric: He has a normal mood and affect. His behavior is normal.  Nursing note and vitals reviewed.  Results for orders placed or performed in visit on 12/16/16  POCT urinalysis dipstick  Result Value Ref Range   Color, UA yellow yellow   Clarity, UA clear clear   Glucose, UA negative negative   Bilirubin, UA negative negative   Ketones, POC UA negative negative   Spec Grav, UA 1.020 1.030 - 1.035   Blood, UA trace-intact (A) negative   pH, UA 6.0 5.0 - 8.0   Protein Ur, POC negative negative   Urobilinogen, UA 1.0 Negative - 2.0   Nitrite, UA Negative Negative   Leukocytes, UA Negative Negative   Dep[facility] screen [facility] 12/16/2016  12/18/2015 12/13/2013  Decreased Interest 0 0 0  Down, Depressed, Hopeless 0 0 0  PHQ - 2 Score 0 0 0       Assessment & Plan:   1. Routine general medical examination at a health care facility   2. Essential hypertension, benign   3. Thyroid nodule   4. Tobacco abuse   5. Generalized anxiety disorder   6. Bee allergy status   7. Screening for prostate cancer   8. Family history of cardiac disorder in father   419. Combined arterial insufficiency and corporo-venous occlusive erectile dysfunction      Essential hypertension, benign Controlled; obtain labs; refills provided; RTC six months. Recommend weight loss, exercise, and low-sodium food choices.  Combined arterial insufficiency and corporo-venous occlusive erectile dysfunction Controlled with two Sildenafil daily PRN.  Refill provided.  Tobacco abuse Highly encourage smoking cessation especially considering family hx of early coronary artery disease and CVA.  Also highly recommend regular dental exams with 25 year history of chewing tobacco use.  Bee allergy status Controlled; refill of epipen provided.  Recommend keeping Benadryl in car and in wallet at all times.  Family history of cardiac disorder in father Warrants aggressive control of weight, lipids, blood pressure.  Highly recommend smoking cessation.  Class 2 obesity due to excess calories with serious comorbidity and body mass index (BMI) of 37.0 to 37.9 in adult Highly recommend weight loss, exercise, 1500-1800 kcal diet per day.  Routine general medical examination at a health care facility Anticipatory guidance --- exercise, weight loss, smoking cessation, safe driving practices.  Patient refuses flu vaccine.   Obtain age appropriate screening labs.   Orders Placed This Encounter  Procedures  . CBC with Differential/Platelet  . Comprehensive metabolic panel    Order Specific Question:   Has the patient fasted?    Answer:   Yes  . Lipid panel    Order  Specific Question:   Has the patient fasted?    Answer:   Yes  . TSH  . PSA  . POCT urinalysis dipstick  . EKG 12-Lead   Meds ordered this encounter  Medications  . lisinopril (PRINIVIL,ZESTRIL) 5 MG tablet    Sig: Take  1 tablet (5 mg total) by mouth daily.    Dispense:  90 tablet    Refill:  3  . sildenafil (REVATIO) 20 MG tablet    Sig: TAKE 1-2 TABLETS BY MOUTH DAILY AS NEEDED AS DIRECTED BY PHYSICIAN.    Dispense:  90 tablet    Refill:  3  . EPINEPHrine (EPIPEN 2-PAK) 0.3 mg/0.3 mL IJ SOAJ injection    Sig: Inject 0.3 mLs (0.3 mg total) into the muscle once.    Dispense:  2 Device    Refill:  1    Return in about 6 months (around 06/18/2017) for recheck high blood pressure.   Amali Uhls Paulita Fujita, M.D. [facility] [address] [place], [place]  [address] [phone] phone [phone] fax

## 2016-12-16 NOTE — Assessment & Plan Note (Signed)
Controlled with two Sildenafil daily PRN.  Refill provided.

## 2016-12-16 NOTE — Progress Notes (Deleted)
     Patient ID: Kristopher DownsGary Bell, male    DOB: 06/28/1976, 41 y.o.   MRN: [id number]  PCP: Nilda SimmerSMITH,KRISTI, MD  Chief Complaint  Patient presents with  . Annual Exam  . Medication Refill    lisinopril    Subjective:   Presents for evaluation of ***.  ***.    Review of Systems     Patient Active Problem List   Diagnosis Date Noted  . Bee allergy status 08/01/2014  . Thyroid nodule 04/25/2013  . Generalized anxiety disorder 04/25/2013  . Mixed hyperlipidemia 04/25/2013  . Unspecified vitamin D deficiency 04/25/2013  . Essential hypertension, benign 10/25/2012  . Right carotid bruit 10/25/2012  . Routine general medical examination at a health care facility 10/25/2012  . Tobacco abuse 10/25/2012     Prior to Admission medications   Medication Sig Start Date End Date Taking? Authorizing Provider  EPINEPHrine (EPIPEN 2-PAK) 0.3 mg/0.3 mL IJ SOAJ injection Inject 0.3 mLs (0.3 mg total) into the muscle once. 05/28/15  Yes Ethelda ChickKristi M Smith, MD  lisinopril (PRINIVIL,ZESTRIL) 5 MG tablet Take 1 tablet (5 mg total) by mouth daily. 12/18/15  Yes Ethelda ChickKristi M Smith, MD  meloxicam (MOBIC) 15 MG tablet TAKE (1) TABLET BY MOUTH EVERY DAY 01/17/16  Yes Ethelda ChickKristi M Smith, MD  methocarbamol (ROBAXIN) 500 MG tablet TAKE 1-2 TABLETS BY MOUTH EVERY 8 HOURS AS NEEDED FOR MUSCLE SPASMS 01/17/16  Yes Ethelda ChickKristi M Smith, MD  sildenafil (REVATIO) 20 MG tablet TAKE 1-2 TABLETS BY MOUTH DAILY AS NEEDED AS DIRECTED BY PHYSICIAN. 07/15/16  Yes Ethelda ChickKristi M Smith, MD     Allergies  Allergen Reactions  . Bee Venom     Yellow jackets       Objective:  Physical Exam         Assessment & Plan:   ***

## 2016-12-16 NOTE — Patient Instructions (Addendum)
   IF you received an x-ray today, you will receive an invoice from [facility]. Please contact [facility] at [phone] with questions or concerns regarding your invoice.   IF you received labwork today, you will receive an invoice from [facility]. Please contact [facility] at [phone] with questions or concerns regarding your invoice.   Our billing staff will not be able to assist you with questions regarding bills from these companies.  You will be contacted with the lab results as soon as they are available. The fastest way to get your results is to activate your My Chart account. Instructions are located on the last page of this paperwork. If you have not heard from us regarding the results in 2 weeks, please contact this office.     Keeping you healthy  Get these tests  Blood pressure- Have your blood pressure checked once a year by your healthcare provider.  Normal blood pressure is 120/80.  Weight- Have your body mass index (BMI) calculated to screen for obesity.  BMI is a measure of body fat based on height and weight. You can also calculate your own BMI at www.nhlbisupport.com/bmi/.  Cholesterol- Have your cholesterol checked regularly starting at age 35, sooner may be necessary if you have diabetes, high blood pressure, if a family member developed heart diseases at an early age or if you smoke.   Chlamydia, HIV, and other sexual transmitted disease- Get screened each year until the age of 25 then within three months of each new sexual partner.  Diabetes- Have your blood sugar checked regularly if you have high blood pressure, high cholesterol, a family history of diabetes or if you are overweight.  Get these vaccines  Flu shot- Every fall.  Tetanus shot- Every 10 years.  Menactra- Single dose; prevents meningitis.  Take these steps  Don't smoke- If you do smoke, ask your healthcare provider about quitting. For tips on how to quit, go to  www.smokefree.gov or call 1-800-QUIT-NOW.  Be physically active- Exercise 5 days a week for at least 30 minutes.  If you are not already physically active start slow and gradually work up to 30 minutes of moderate physical activity.  Examples of moderate activity include walking briskly, mowing the yard, dancing, swimming bicycling, etc.  Eat a healthy diet- Eat a variety of healthy foods such as fruits, vegetables, low fat milk, low fat cheese, yogurt, lean meats, poultry, fish, beans, tofu, etc.  For more information on healthy eating, go to www.thenutritionsource.org  Drink alcohol in moderation- Limit alcohol intake two drinks or less a day.  Never drink and drive.  Dentist- Brush and floss teeth twice daily; visit your dentis twice a year.  Depression-Your emotional health is as important as your physical health.  If you're feeling down, losing interest in things you normally enjoy please talk with your healthcare provider.  Gun Safety- If you keep a gun in your home, keep it unloaded and with the safety lock on.  Bullets should be stored separately.  Helmet use- Always wear a helmet when riding a motorcycle, bicycle, rollerblading or skateboarding.  Safe sex- If you may be exposed to a sexually transmitted infection, use a condom  Seat belts- Seat bels can save your life; always wear one.  Smoke/Carbon Monoxide detectors- These detectors need to be installed on the appropriate level of your home.  Replace batteries at least once a year.  Skin Cancer- When out in the sun, cover up and use sunscreen SPF 15 or   higher.  Violence- If anyone is threatening or hurting you, please tell your healthcare provider. 

## 2016-12-16 NOTE — Assessment & Plan Note (Signed)
Controlled; obtain labs; refills provided; RTC six months. Recommend weight loss, exercise, and low-sodium food choices.

## 2016-12-16 NOTE — Assessment & Plan Note (Signed)
Highly encourage smoking cessation especially considering family hx of early coronary artery disease and CVA.  Also highly recommend regular dental exams with 25 year history of chewing tobacco use.

## 2016-12-17 DIAGNOSIS — IMO0001 Reserved for inherently not codable concepts without codable children: Secondary | ICD-10-CM | POA: Insufficient documentation

## 2016-12-17 LAB — CBC WITH DIFFERENTIAL/PLATELET
BASOS ABS: 0 10*3/uL (ref 0.0–0.2)
BASOS: 0 %
EOS (ABSOLUTE): 0.3 10*3/uL (ref 0.0–0.4)
Eos: 4 %
Hematocrit: 49.3 % (ref 37.5–51.0)
Hemoglobin: 16.7 g/dL (ref 13.0–17.7)
IMMATURE GRANS (ABS): 0 10*3/uL (ref 0.0–0.1)
Immature Granulocytes: 0 %
LYMPHS ABS: 2.2 10*3/uL (ref 0.7–3.1)
Lymphs: 26 %
MCH: 30.9 pg (ref 26.6–33.0)
MCHC: 33.9 g/dL (ref 31.5–35.7)
MCV: 91 fL (ref 79–97)
Monocytes Absolute: 0.6 10*3/uL (ref 0.1–0.9)
Monocytes: 7 %
NEUTROS ABS: 5.3 10*3/uL (ref 1.4–7.0)
Neutrophils: 63 %
PLATELETS: 198 10*3/uL (ref 150–379)
RBC: 5.4 x10E6/uL (ref 4.14–5.80)
RDW: 14.1 % (ref 12.3–15.4)
WBC: 8.5 10*3/uL (ref 3.4–10.8)

## 2016-12-17 LAB — COMPREHENSIVE METABOLIC PANEL
A/G RATIO: 2 (ref 1.2–2.2)
ALK PHOS: 73 IU/L (ref 39–117)
ALT: 18 IU/L (ref 0–44)
AST: 12 IU/L (ref 0–40)
Albumin: 4.2 g/dL (ref 3.5–5.5)
BILIRUBIN TOTAL: 0.3 mg/dL (ref 0.0–1.2)
BUN / CREAT RATIO: 24 — AB (ref 9–20)
BUN: 13 mg/dL (ref 6–24)
CHLORIDE: 104 mmol/L (ref 96–106)
CO2: 21 mmol/L (ref 18–29)
Calcium: 8.7 mg/dL (ref 8.7–10.2)
Creatinine, Ser: 0.55 mg/dL — ABNORMAL LOW (ref 0.76–1.27)
GFR calc non Af Amer: 130 mL/min/{1.73_m2} (ref >59)
GFR, EST AFRICAN AMERICAN: 151 mL/min/{1.73_m2} (ref >59)
GLUCOSE: 96 mg/dL (ref 65–99)
Globulin, Total: 2.1 g/dL (ref 1.5–4.5)
POTASSIUM: 4.4 mmol/L (ref 3.5–5.2)
Sodium: 143 mmol/L (ref 134–144)
TOTAL PROTEIN: 6.3 g/dL (ref 6.0–8.5)

## 2016-12-17 LAB — LIPID PANEL
CHOLESTEROL TOTAL: 169 mg/dL (ref 100–199)
Chol/HDL Ratio: 4.4 ratio units (ref 0.0–5.0)
HDL: 38 mg/dL — ABNORMAL LOW (ref >39)
LDL Calculated: 96 mg/dL (ref 0–99)
Triglycerides: 174 mg/dL — ABNORMAL HIGH (ref 0–149)
VLDL Cholesterol Cal: 35 mg/dL (ref 5–40)

## 2016-12-17 LAB — TSH: TSH: 2.29 u[IU]/mL (ref 0.450–4.500)

## 2016-12-17 NOTE — Assessment & Plan Note (Signed)
Anticipatory guidance --- exercise, weight loss, smoking cessation, safe driving practices.  Patient refuses flu vaccine.   Obtain age appropriate screening labs.

## 2016-12-17 NOTE — Assessment & Plan Note (Signed)
Warrants aggressive control of weight, lipids, blood pressure.  Highly recommend smoking cessation.

## 2016-12-17 NOTE — Assessment & Plan Note (Signed)
Highly recommend weight loss, exercise, 1500-1800 kcal diet per day.

## 2016-12-17 NOTE — Assessment & Plan Note (Signed)
Controlled; refill of epipen provided.  Recommend keeping Benadryl in car and in wallet at all times.

## 2017-01-12 ENCOUNTER — Telehealth: Payer: Self-pay | Admitting: Family Medicine

## 2017-01-12 MED ORDER — CITALOPRAM HYDROBROMIDE 20 MG PO TABS: 20.0000 mg | ORAL_TABLET | Freq: Every day | ORAL | 1 refills | Status: DC

## 2017-01-12 NOTE — Telephone Encounter (Signed)
Wife called requesting rx for Citalopram for patient.  At CPE on 12/16/16, patient discussed stressors due to health issues with inlaws.  Admits to excessive stress during that visit yet doing well. PHQ 2 at visit 0.  A/P: Acute stress reaction: agreeable to Citalopram  daily yet recommend good sleep hygiene and exercise for stress management.

## 2017-06-16 ENCOUNTER — Ambulatory Visit: Payer: PRIVATE HEALTH INSURANCE | Admitting: Family Medicine

## 2017-10-22 ENCOUNTER — Other Ambulatory Visit: Payer: Self-pay | Admitting: Family Medicine

## 2017-11-30 ENCOUNTER — Telehealth: Payer: Self-pay | Admitting: Family Medicine

## 2017-11-30 DIAGNOSIS — I10 Essential (primary) hypertension: Secondary | ICD-10-CM

## 2017-11-30 MED ORDER — LISINOPRIL 5 MG PO TABS: 5.0000 mg | ORAL_TABLET | Freq: Every day | ORAL | 0 refills | Status: DC

## 2017-11-30 NOTE — Telephone Encounter (Signed)
Requesting refill on Lisinopril; work very busy and will need to reschedule appointment.  [facility] [facility]

## 2018-02-17 ENCOUNTER — Ambulatory Visit: Payer: PRIVATE HEALTH INSURANCE | Admitting: Family Medicine

## 2018-02-17 ENCOUNTER — Encounter: Payer: Self-pay | Admitting: Family Medicine

## 2018-02-17 VITALS — BP 130/72 | HR 82 | Temp 98.0°F | Resp 16 | Ht 72.84 in | Wt 292.0 lb

## 2018-02-17 DIAGNOSIS — I10 Essential (primary) hypertension: Secondary | ICD-10-CM | POA: Diagnosis not present

## 2018-02-17 DIAGNOSIS — F411 Generalized anxiety disorder: Secondary | ICD-10-CM | POA: Diagnosis not present

## 2018-02-17 DIAGNOSIS — Z720 Tobacco use: Secondary | ICD-10-CM | POA: Diagnosis not present

## 2018-02-17 DIAGNOSIS — N5203 Combined arterial insufficiency and corporo-venous occlusive erectile dysfunction: Secondary | ICD-10-CM

## 2018-02-17 DIAGNOSIS — R3915 Urgency of urination: Secondary | ICD-10-CM

## 2018-02-17 DIAGNOSIS — Z131 Encounter for screening for diabetes mellitus: Secondary | ICD-10-CM

## 2018-02-17 LAB — POCT URINALYSIS DIP (MANUAL ENTRY)
BILIRUBIN UA: NEGATIVE
Blood, UA: NEGATIVE
GLUCOSE UA: NEGATIVE mg/dL
Ketones, POC UA: NEGATIVE mg/dL
Nitrite, UA: NEGATIVE
PH UA: 5.5 (ref 5.0–8.0)
Protein Ur, POC: NEGATIVE mg/dL
Spec Grav, UA: 1.025 (ref 1.010–1.025)
Urobilinogen, UA: 1 E.U./dL

## 2018-02-17 MED ORDER — LISINOPRIL 5 MG PO TABS: 5.0000 mg | ORAL_TABLET | Freq: Every day | ORAL | 3 refills | Status: AC

## 2018-02-17 NOTE — Patient Instructions (Addendum)
   IF you received an x-ray today, you will receive an invoice from [facility]. Please contact [facility] at [phone] with questions or concerns regarding your invoice.   IF you received labwork today, you will receive an invoice from [facility]. Please contact [facility] at [phone] with questions or concerns regarding your invoice.   Our billing staff will not be able to assist you with questions regarding bills from these companies.  You will be contacted with the lab results as soon as they are available. The fastest way to get your results is to activate your My Chart account. Instructions are located on the last page of this paperwork. If you have not heard from us regarding the results in 2 weeks, please contact this office.      Managing Your Hypertension Hypertension is commonly called high blood pressure. This is when the force of your blood pressing against the walls of your arteries is too strong. Arteries are blood vessels that carry blood from your heart throughout your body. Hypertension forces the heart to work harder to pump blood, and may cause the arteries to become narrow or stiff. Having untreated or uncontrolled hypertension can cause heart attack, stroke, kidney disease, and other problems. What are blood pressure readings? A blood pressure reading consists of a higher number over a lower number. Ideally, your blood pressure should be below 120/80. The first ("top") number is called the systolic pressure. It is a measure of the pressure in your arteries as your heart beats. The second ("bottom") number is called the diastolic pressure. It is a measure of the pressure in your arteries as the heart relaxes. What does my blood pressure reading mean? Blood pressure is classified into four stages. Based on your blood pressure reading, your health care provider may use the following stages to determine what type of treatment you need, if any. Systolic  pressure and diastolic pressure are measured in a unit called mm Hg. Normal  Systolic pressure: below 120.  Diastolic pressure: below 80. Elevated  Systolic pressure: 120-129.  Diastolic pressure: below 80. Hypertension stage 1  Systolic pressure: 130-139.  Diastolic pressure: 80-89. Hypertension stage 2  Systolic pressure: 140 or above.  Diastolic pressure: 90 or above. What health risks are associated with hypertension? Managing your hypertension is an important responsibility. Uncontrolled hypertension can lead to:  A heart attack.  A stroke.  A weakened blood vessel (aneurysm).  Heart failure.  Kidney damage.  Eye damage.  Metabolic syndrome.  Memory and concentration problems.  What changes can I make to manage my hypertension? Hypertension can be managed by making lifestyle changes and possibly by taking medicines. Your health care provider will help you make a plan to bring your blood pressure within a normal range. Eating and drinking  Eat a diet that is high in fiber and potassium, and low in salt (sodium), added sugar, and fat. An example eating plan is called the DASH (Dietary Approaches to Stop Hypertension) diet. To eat this way: ? Eat plenty of fresh fruits and vegetables. Try to fill half of your plate at each meal with fruits and vegetables. ? Eat whole grains, such as whole wheat pasta, brown rice, or whole grain bread. Fill about one quarter of your plate with whole grains. ? Eat low-fat diary products. ? Avoid fatty cuts of meat, processed or cured meats, and poultry with skin. Fill about one quarter of your plate with lean proteins such as fish, chicken without skin, beans, eggs,   and tofu. ? Avoid premade and processed foods. These tend to be higher in sodium, added sugar, and fat.  Reduce your daily sodium intake. Most people with hypertension should eat less than 1,500 mg of sodium a day.  Limit alcohol intake to no more than 1 drink a day  for nonpregnant women and 2 drinks a day for men. One drink equals 12 oz of beer, 5 oz of wine, or 1 oz of hard liquor. Lifestyle  Work with your health care provider to maintain a healthy body weight, or to lose weight. Ask what an ideal weight is for you.  Get at least 30 minutes of exercise that causes your heart to beat faster (aerobic exercise) most days of the week. Activities may include walking, swimming, or biking.  Include exercise to strengthen your muscles (resistance exercise), such as weight lifting, as part of your weekly exercise routine. Try to do these types of exercises for 30 minutes at least 3 days a week.  Do not use any products that contain nicotine or tobacco, such as cigarettes and e-cigarettes. If you need help quitting, ask your health care provider.  Control any long-term (chronic) conditions you have, such as high cholesterol or diabetes. Monitoring  Monitor your blood pressure at home as told by your health care provider. Your personal target blood pressure may vary depending on your medical conditions, your age, and other factors.  Have your blood pressure checked regularly, as often as told by your health care provider. Working with your health care provider  Review all the medicines you take with your health care provider because there may be side effects or interactions.  Talk with your health care provider about your diet, exercise habits, and other lifestyle factors that may be contributing to hypertension.  Visit your health care provider regularly. Your health care provider can help you create and adjust your plan for managing hypertension. Will I need medicine to control my blood pressure? Your health care provider may prescribe medicine if lifestyle changes are not enough to get your blood pressure under control, and if:  Your systolic blood pressure is 130 or higher.  Your diastolic blood pressure is 80 or higher.  Take medicines only as told  by your health care provider. Follow the directions carefully. Blood pressure medicines must be taken as prescribed. The medicine does not work as well when you skip doses. Skipping doses also puts you at risk for problems. Contact a health care provider if:  You think you are having a reaction to medicines you have taken.  You have repeated (recurrent) headaches.  You feel dizzy.  You have swelling in your ankles.  You have trouble with your vision. Get help right away if:  You develop a severe headache or confusion.  You have unusual weakness or numbness, or you feel faint.  You have severe pain in your chest or abdomen.  You vomit repeatedly.  You have trouble breathing. Summary  Hypertension is when the force of blood pumping through your arteries is too strong. If this condition is not controlled, it may put you at risk for serious complications.  Your personal target blood pressure may vary depending on your medical conditions, your age, and other factors. For most people, a normal blood pressure is less than 120/80.  Hypertension is managed by lifestyle changes, medicines, or both. Lifestyle changes include weight loss, eating a healthy, low-sodium diet, exercising more, and limiting alcohol. This information is not intended to replace advice   given to you by your health care provider. Make sure you discuss any questions you have with your health care provider. Document Released: 06/09/2012 Document Revised: 08/13/2016 Document Reviewed: 08/13/2016 Elsevier Interactive Patient Education  2018 Elsevier Inc.  

## 2018-02-17 NOTE — Progress Notes (Signed)
Subjective:    Patient ID: Kristopher Bell, male    DOB: 02/19/1976, 42 y.o.   MRN: [id number]  02/17/2018  Chronic Conditions (follow-up )    HPI This 42 y.o. male presents for fourteen month follow-up of hypertension, obesity. BP is good; 130/68-75. Has gained 15 pounds.   Father admitted seven times in six weeks.  Pleural effusions; HD won't remove; has been maintained on HD due to DMII.    Scar tissue from Metformin; scar tissue; history of severe nephrolithiasis.  Renal tube with improvement.   Inlaws both declining medically; mother in law passed away of ALS.  Father in law [facility] with delirium, SAH. Not taking Citalopram due to fatigue; decreased activity.   BP Readings from Last 3 Encounters:  02/17/18 130/72  12/16/16 137/84  12/18/15 138/89   Wt Readings from Last 3 Encounters:  02/17/18 292 lb (132.5 kg)  12/16/16 277 lb 6.4 oz (125.8 kg)  12/18/15 271 lb 3.2 oz (123 kg)   Immunization History  Administered Date(s) Administered  . Td 09/29/2009    Review of Systems  Constitutional: Negative for activity change, appetite change, chills, diaphoresis, fatigue and fever.  Respiratory: Negative for cough and shortness of breath.   Cardiovascular: Negative for chest pain, palpitations and leg swelling.  Gastrointestinal: Negative for abdominal pain, diarrhea, nausea and vomiting.  Endocrine: Negative for cold intolerance, heat intolerance, polydipsia, polyphagia and polyuria.  Skin: Negative for color change, rash and wound.  Neurological: Negative for dizziness, tremors, seizures, syncope, facial asymmetry, speech difficulty, weakness, light-headedness, numbness and headaches.  Psychiatric/Behavioral: Negative for dysphoric mood and sleep disturbance. The patient is not nervous/anxious.     Past Medical History:  Diagnosis Date  . Hypertension 09/29/2005   onset age 25.  . Nephrolithiasis    age 76.  Single episode.  Harmon/Urologist.  . Obesity,  unspecified   . Tobacco use disorder   . 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 No pertinent surgical history. Allergies  Allergen Reactions  . Bee Venom     Yellow jackets   Current Outpatient Medications on File Prior to Visit  Medication Sig Dispense Refill  . sildenafil (REVATIO) 20 MG tablet TAKE 1-2 TABLETS BY MOUTH DAILY AS NEEDED AS DIRECTED BY PHYSICIAN. 90 tablet 3   No current facility-administered medications on file prior to visit.    Social History   Socioeconomic History  . Marital status: Married    Spouse name: Not on file  . Number of children: 2  . Years of education: 63  . Highest education level: Not on file  Occupational History  . Occupation: body shop    Employer: [employer]    Comment: [employer] x 22 years  Social Needs  . Financial resource strain: Not on file  . Food insecurity:    Worry: Not on file    Inability: Not on file  . Transportation needs:    Medical: Not on file    Non-medical: Not on file  Tobacco Use  . Smoking status: Current Every Day Smoker    Packs/day: 0.50    Years: 20.00    Pack years: 10.00    Types: Cigarettes  . Smokeless tobacco: Current User    Types: Snuff, Chew  Substance and Sexual Activity  . Alcohol use: No    Comment: socially; twice yearly.  . Drug use: No  . Sexual activity: Yes    Comment: wife BTL.  Lifestyle  .  Physical activity:    Days per week: Not on file    Minutes per session: Not on file  . Stress: Not on file  Relationships  . Social connections:    Talks on phone: Not on file    Gets together: Not on file    Attends religious service: Not on file    Active member of club or organization: Not on file    Attends meetings of clubs or organizations: Not on file    Relationship status: Not on file  . Intimate partner violence:    Fear of current or ex partner: Not on file    Emotionally abused: Not on file    Physically abused: Not on file    Forced sexual  activity: Not on file  Other Topics Concern  . Not on file  Social History Narrative   Marital status:  married x 17 years. Feels safe at home.     Children: 2 sons (19, 30)      Lives: with wife, 1 son.      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      Exercise: work is physically demanding; no formal exercise program.        Seatbelt:  100% of time. No texting.      Guns: 15 guns; unloaded.        Sunscreen:  SPF 50.        Smoke alarm in the home.      Caffeine use:Moderate amount.      Tobacco:  1ppd x 22 years; quit one time for two weeks.      Alcohol: none      Drugs: none   Family History  Problem Relation Age of Onset  . Kidney disease Mother        spastic bladder, hematuria.  . Diverticulitis Mother   . Diabetes Father   . Stroke Father 18  . Hypertension Father 75  . Heart disease Father 61       cardiac stenting; no AMI.  Marland Kitchen Kidney failure Father        HD       Objective:    BP 130/72   Pulse 82   Temp 98 F (36.7 C) (Oral)   Resp 16   Ht 6' 0.84" (1.85 m)   Wt 292 lb (132.5 kg)   SpO2 96%   BMI 38.70 kg/m  Physical Exam  Constitutional: He is oriented to person, place, and time. He appears well-developed and well-nourished. No distress.  HENT:  Head: Normocephalic and atraumatic.  Right Ear: External ear normal.  Left Ear: External ear normal.  Nose: Nose normal.  Mouth/Throat: Oropharynx is clear and moist.  Eyes: Pupils are equal, round, and reactive to light. Conjunctivae and EOM are normal.  Neck: Normal range of motion. Neck supple. Carotid bruit is not present. No thyromegaly present.  Cardiovascular: Normal rate, regular rhythm, normal heart sounds and intact distal pulses. Exam reveals no gallop and no friction rub.  No murmur heard. Pulmonary/Chest: Effort normal and breath sounds normal. He has no wheezes. He has no rales.  Abdominal: Soft. Bowel sounds are normal. He exhibits no distension and no mass. There is no tenderness.  There is no rebound and no guarding.  Lymphadenopathy:    He has no cervical adenopathy.  Neurological: He is alert and oriented to person, place, and time. No cranial nerve deficit.  Skin: Skin is warm and dry. No rash noted. He  is not diaphoretic.  Psychiatric: He has a normal mood and affect. His behavior is normal.  Nursing note and vitals reviewed.  No results found. Depression screen [facility] 2/9 02/17/2018 12/16/2016 12/18/2015 12/13/2013  Decreased Interest 0 0 0 0  Down, Depressed, Hopeless 0 0 0 0  PHQ - 2 Score 0 0 0 0   Fall Risk  02/17/2018 12/16/2016 12/18/2015  Falls in the past year? No No No        Assessment & Plan:   1. Essential hypertension, benign   2. Generalized anxiety disorder   3. Tobacco abuse   4. Combined arterial insufficiency and corporo-venous occlusive erectile dysfunction   5. Screening for diabetes mellitus   6. Urinary urgency     Controlled hypertension, anxiety disorder, and erectile dysfunction obtain labs for chronic disease management.  Refills provided without adjustments.:  Tobacco abuse: Pre-contemplative.  Smoking cessation instruction/counseling given:  counseled patient on the dangers of tobacco use, advised patient to stop smoking, and reviewed strategies to maximize success.  Urinary urgency: New.  Obtain PSA and urinalysis.  Limit caffeine intake during the day.  Limit sugar intake during the day.   Orders Placed This Encounter  Procedures  . CBC with Differential/Platelet  . Comprehensive metabolic panel    Order Specific Question:   Has the patient fasted?    Answer:   No  . Lipid panel    Order Specific Question:   Has the patient fasted?    Answer:   No  . Hemoglobin A1c  . TSH  . PSA  . POCT urinalysis dipstick   Meds ordered this encounter  Medications  . lisinopril (PRINIVIL,ZESTRIL) 5 MG tablet    Sig: Take 1 tablet (5 mg total) by mouth daily.    Dispense:  90 tablet    Refill:  3    Return in about 6 months  (around 08/20/2018) for follow-up chronic medical conditions.   Lyna Laningham Paulita Fujita, M.D. Primary Care at [facility] previously [facility] [address] [place], [place]  [address] [phone] phone [phone] fax

## 2018-02-18 ENCOUNTER — Encounter: Payer: Self-pay | Admitting: Family Medicine

## 2018-02-18 LAB — CBC WITH DIFFERENTIAL/PLATELET
Basophils Absolute: 0 10*3/uL (ref 0.0–0.2)
Basos: 0 %
EOS (ABSOLUTE): 0.3 10*3/uL (ref 0.0–0.4)
EOS: 3 %
HEMATOCRIT: 50.5 % (ref 37.5–51.0)
Hemoglobin: 17 g/dL (ref 13.0–17.7)
IMMATURE GRANULOCYTES: 0 %
Immature Grans (Abs): 0 10*3/uL (ref 0.0–0.1)
LYMPHS: 22 %
Lymphocytes Absolute: 2 10*3/uL (ref 0.7–3.1)
MCH: 30.4 pg (ref 26.6–33.0)
MCHC: 33.7 g/dL (ref 31.5–35.7)
MCV: 90 fL (ref 79–97)
MONOCYTES: 7 %
Monocytes Absolute: 0.6 10*3/uL (ref 0.1–0.9)
Neutrophils Absolute: 6.3 10*3/uL (ref 1.4–7.0)
Neutrophils: 68 %
Platelets: 215 10*3/uL (ref 150–450)
RBC: 5.6 x10E6/uL (ref 4.14–5.80)
RDW: 13.8 % (ref 12.3–15.4)
WBC: 9.3 10*3/uL (ref 3.4–10.8)

## 2018-02-18 LAB — COMPREHENSIVE METABOLIC PANEL
ALBUMIN: 4.1 g/dL (ref 3.5–5.5)
ALT: 21 IU/L (ref 0–44)
AST: 13 IU/L (ref 0–40)
Albumin/Globulin Ratio: 2.3 — ABNORMAL HIGH (ref 1.2–2.2)
Alkaline Phosphatase: 80 IU/L (ref 39–117)
BUN/Creatinine Ratio: 16 (ref 9–20)
BUN: 10 mg/dL (ref 6–24)
Bilirubin Total: <0.2 mg/dL (ref 0.0–1.2)
CALCIUM: 8.6 mg/dL — AB (ref 8.7–10.2)
CO2: 20 mmol/L (ref 20–29)
CREATININE: 0.61 mg/dL — AB (ref 0.76–1.27)
Chloride: 107 mmol/L — ABNORMAL HIGH (ref 96–106)
GFR calc Af Amer: 143 mL/min/{1.73_m2} (ref >59)
GFR, EST NON AFRICAN AMERICAN: 124 mL/min/{1.73_m2} (ref >59)
GLOBULIN, TOTAL: 1.8 g/dL (ref 1.5–4.5)
Glucose: 89 mg/dL (ref 65–99)
Potassium: 4.4 mmol/L (ref 3.5–5.2)
SODIUM: 144 mmol/L (ref 134–144)
Total Protein: 5.9 g/dL — ABNORMAL LOW (ref 6.0–8.5)

## 2018-02-18 LAB — LIPID PANEL
Chol/HDL Ratio: 4.6 ratio (ref 0.0–5.0)
Cholesterol, Total: 156 mg/dL (ref 100–199)
HDL: 34 mg/dL — ABNORMAL LOW (ref >39)
LDL CALC: 83 mg/dL (ref 0–99)
TRIGLYCERIDES: 194 mg/dL — AB (ref 0–149)
VLDL CHOLESTEROL CAL: 39 mg/dL (ref 5–40)

## 2018-02-18 LAB — HEMOGLOBIN A1C
Est. average glucose Bld gHb Est-mCnc: 105 mg/dL
Hgb A1c MFr Bld: 5.3 % (ref 4.8–5.6)

## 2018-02-18 LAB — TSH: TSH: 2.54 u[IU]/mL (ref 0.450–4.500)

## 2018-02-18 LAB — PSA: Prostate Specific Ag, Serum: 2 ng/mL (ref 0.0–4.0)

## 2023-07-07 ENCOUNTER — Inpatient Hospital Stay: Payer: BC Managed Care – PPO | Attending: Internal Medicine | Admitting: Internal Medicine

## 2023-07-07 ENCOUNTER — Encounter: Payer: Self-pay | Admitting: Internal Medicine

## 2023-07-07 ENCOUNTER — Inpatient Hospital Stay: Payer: BC Managed Care – PPO

## 2023-07-07 VITALS — BP 153/110 | HR 108 | Temp 98.9°F | Ht 72.84 in | Wt 303.6 lb

## 2023-07-07 DIAGNOSIS — D72829 Elevated white blood cell count, unspecified: Secondary | ICD-10-CM | POA: Diagnosis not present

## 2023-07-07 DIAGNOSIS — F17210 Nicotine dependence, cigarettes, uncomplicated: Secondary | ICD-10-CM | POA: Insufficient documentation

## 2023-07-07 DIAGNOSIS — Z79899 Other long term (current) drug therapy: Secondary | ICD-10-CM | POA: Diagnosis not present

## 2023-07-07 DIAGNOSIS — D751 Secondary polycythemia: Secondary | ICD-10-CM | POA: Diagnosis present

## 2023-07-07 DIAGNOSIS — I10 Essential (primary) hypertension: Secondary | ICD-10-CM | POA: Insufficient documentation

## 2023-07-07 LAB — CBC WITH DIFFERENTIAL/PLATELET
Abs Immature Granulocytes: 0.04 10*3/uL (ref 0.00–0.07)
Basophils Absolute: 0.1 10*3/uL (ref 0.0–0.1)
Basophils Relative: 1 %
Eosinophils Absolute: 0.2 10*3/uL (ref 0.0–0.5)
Eosinophils Relative: 3 %
HCT: 56.7 % — ABNORMAL HIGH (ref 39.0–52.0)
Hemoglobin: 18.3 g/dL — ABNORMAL HIGH (ref 13.0–17.0)
Immature Granulocytes: 1 %
Lymphocytes Relative: 18 %
Lymphs Abs: 1.4 10*3/uL (ref 0.7–4.0)
MCH: 30.1 pg (ref 26.0–34.0)
MCHC: 32.3 g/dL (ref 30.0–36.0)
MCV: 93.4 fL (ref 80.0–100.0)
Monocytes Absolute: 0.8 10*3/uL (ref 0.1–1.0)
Monocytes Relative: 10 %
Neutro Abs: 5.5 10*3/uL (ref 1.7–7.7)
Neutrophils Relative %: 67 %
Platelets: 183 10*3/uL (ref 150–400)
RBC: 6.07 MIL/uL — ABNORMAL HIGH (ref 4.22–5.81)
RDW: 13.4 % (ref 11.5–15.5)
WBC: 7.9 10*3/uL (ref 4.0–10.5)
nRBC: 0 % (ref 0.0–0.2)

## 2023-07-07 LAB — COMPREHENSIVE METABOLIC PANEL
ALT: 23 U/L (ref 0–44)
AST: 16 U/L (ref 15–41)
Albumin: 4.3 g/dL (ref 3.5–5.0)
Alkaline Phosphatase: 79 U/L (ref 38–126)
Anion gap: 8 (ref 5–15)
BUN: 11 mg/dL (ref 6–20)
CO2: 28 mmol/L (ref 22–32)
Calcium: 8.9 mg/dL (ref 8.9–10.3)
Chloride: 101 mmol/L (ref 98–111)
Creatinine, Ser: 0.69 mg/dL (ref 0.61–1.24)
GFR, Estimated: >60 mL/min (ref >60)
Glucose, Bld: 91 mg/dL (ref 70–99)
Potassium: 4.5 mmol/L (ref 3.5–5.1)
Sodium: 137 mmol/L (ref 135–145)
Total Bilirubin: 0.6 mg/dL (ref 0.3–1.2)
Total Protein: 7.3 g/dL (ref 6.5–8.1)

## 2023-07-07 LAB — LACTATE DEHYDROGENASE: LDH: 127 U/L (ref 98–192)

## 2023-07-07 LAB — C-REACTIVE PROTEIN: CRP: 3.2 mg/dL — ABNORMAL HIGH (ref <1.0)

## 2023-07-07 NOTE — Assessment & Plan Note (Addendum)
#   Erythrocytosis- [since 2022] currently patient is symptomatic. PCP-JAK2 V617F (p.Val617Phe; c.1849G>T) mutation not detected. Jak- Exon 12- NEG  # I had a long discussion the patient regarding the potential causes of her abnormal blood counts-both primary bone marrow problem versus reactive/secondary causes [smoking, OSA diuretics etc.].  Likely secondary as JAK2 testing is negative.  For now I  recommend checking CBC;CMP jak 2; LDH; erythropoietin levels; carbon monoxide levels.  HOLD OFF bone marrow at this time. #I discussed the role of phlebotomy in bringing the hematocrit/hemoglobin down-avoid any thromboembolic events.  The role of phlebotomy in secondary erythrocytosis is unclear/controversial.  However, phlebotomy is a standard of care [goal hematocrit less than 45] for polycythemia vera.   # Mild leukocytosis again likely secondary to inflammatory-smoking.  Check CRP.  Check BCR-ABL.  # Active smoker: Discussed with the patient regarding the ill effects of smoking- including but not limited to cardiac lung and vascular diseases and malignancies. Counseled against smoking; patient- interested. Recommend talking to her PCP regarding smoking cessation. Declines chantinx [vivid dream]; wants to quit   # HNT- diastolic 113; [white coaot]- better at home; repeat blood pressure  Thank you Dr.Smith or allowing me to participate in the care of your pleasant patient. Please do not hesitate to contact me with questions or concerns in the interim.  # DISPOSITION: # repeat BP today # labs- ordered # follow up in 2-3 weeks- MD: no labs- Dr.B  # 45 minutes face-to-face with the patient discussing the above plan of care; more than 50% of time spent on counseling and coordination. My contact information was given; and all questions were answered. The patient knows to call the clinic with any problems, questions or concerns.

## 2023-07-07 NOTE — Progress Notes (Signed)
[facility] CONSULT NOTE  Patient Care Team: Ethelda Chick, MD as PCP - General (Family Medicine) Earna Coder, MD as Consulting Physician (Internal Medicine)  CHIEF COMPLAINTS/PURPOSE OF CONSULTATION: ERYTHROCYTOSIS   HEMATOLOGY HISTORY  # ERYTHROCYTOSIS  [Hb; WBC; platelets]  HISTORY OF PRESENTING ILLNESS:  Kristopher Bell 47 y.o.  male pleasant patient with longstanding history of smoking without any diagnosis of COPD is referred to [place] for further evaluation of erythrocytosis incidentally noted on routine blood work.  Patient denies any history of blood clots.  Denies any history of congestive heart failure. Patient denies any pulsating headaches.  Denies any worsening fatigue.   Interestingly notes that cousins have similar problems with elevated counts needing phlebotomies.   Smoking:1ppd.  Lung Disease/COPD:none OSA/CPAP: hx of snore; NO Sleep study.  Hx of DVT/PE or Stroke: none Testosterone: none Diuretics: none    Review of Systems  Constitutional:  Negative for chills, diaphoresis, fever, malaise/fatigue and weight loss.  HENT:  Negative for nosebleeds and sore throat.   Eyes:  Negative for double vision.  Respiratory:  Negative for cough, hemoptysis, sputum production, shortness of breath and wheezing.   Cardiovascular:  Negative for chest pain, palpitations, orthopnea and leg swelling.  Gastrointestinal:  Negative for abdominal pain, blood in stool, constipation, diarrhea, heartburn, melena, nausea and vomiting.  Genitourinary:  Negative for dysuria, frequency and urgency.  Musculoskeletal:  Negative for back pain and joint pain.  Skin: Negative.  Negative for itching and rash.  Neurological:  Negative for dizziness, tingling, focal weakness, weakness and headaches.  Endo/Heme/Allergies:  Does not bruise/bleed easily.  Psychiatric/Behavioral:  Negative for depression. The patient is not nervous/anxious and does not have insomnia.       MEDICAL HISTORY:  Past Medical History:  Diagnosis Date   Hypertension 09/29/2005   onset age 72.   Nephrolithiasis    age 74.  Single episode.  [facility]armon/Urologist.   Obesity, unspecified    Tobacco use disorder    Toxic effect of venom(989.5)     SURGICAL HISTORY: History reviewed. No pertinent surgical history.  SOCIAL HISTORY: Social History   Socioeconomic History   Marital status: Married    Spouse name: Not on file   Number of children: 2   Years of education: 12   Highest education level: Not on file  Occupational History   Occupation: body shop    Employer: [employer]    Comment: [employer] x 22 years  Tobacco Use   Smoking status: Every Day    Current packs/day: 0.50    Average packs/day: 0.5 packs/day for 20.0 years (10.0 ttl pk-yrs)    Types: Cigarettes   Smokeless tobacco: Current    Types: Snuff, Chew  Vaping Use   Vaping status: Never Used  Substance and Sexual Activity   Alcohol use: No    Comment: socially; twice yearly.   Drug use: No   Sexual activity: Yes    Comment: wife BTL.  Other Topics Concern   Not on file  Social History Narrative   Marital status:  married x 17 years. Feels safe at home.     Children: 2 sons (19, 41)      Lives: with wife, 1 son.      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      Exercise: work is physically demanding; no formal exercise program.        Seatbelt:  100% of time. No texting.  Guns: 15 guns; unloaded.        Sunscreen:  SPF 50.        Smoke alarm in the home.      Caffeine use:Moderate amount.      Tobacco:  1ppd x 22 years; quit one time for two weeks.      Alcohol: none      Drugs: none   Social Determinants of Health   Financial Resource Strain: Low Risk  (05/28/2023)   Received from [facility]   Overall Financial Resource Strain (CARDIA)    Difficulty of Paying Living Expenses: Not hard at all  Food Insecurity: No Food Insecurity (07/07/2023)   Hunger  Vital Sign    Worried About Running Out of Food in the Last Year: Never true    Ran Out of Food in the Last Year: Never true  Transportation Needs: No Transportation Needs (07/07/2023)   PRAPARE - Administrator, Civil Service (Medical): No    Lack of Transportation (Non-Medical): No  Physical Activity: Inactive (05/28/2023)   Received from [facility]   Exercise Vital Sign    Days of Exercise per Week: 0 days    Minutes of Exercise per Session: 0 min  Stress: Not on file  Social Connections: Not on file  Intimate Partner Violence: Not At Risk (07/07/2023)   Humiliation, Afraid, Rape, and Kick questionnaire    Fear of Current or Ex-Partner: No    Emotionally Abused: No    Physically Abused: No    Sexually Abused: No    FAMILY HISTORY: Family History  Problem Relation Age of Onset   Kidney disease Mother        spastic bladder, hematuria.   Diverticulitis Mother    Diabetes Father    Stroke Father 20   Hypertension Father 85   Heart disease Father 72       cardiac stenting; no AMI.   Kidney failure Father        HD    ALLERGIES:  is allergic to bee venom.  MEDICATIONS:  Current Outpatient Medications  Medication Sig Dispense Refill   lisinopril (PRINIVIL,ZESTRIL) 5 MG tablet Take 1 tablet (5 mg total) by mouth daily. (Patient taking differently: Take 20 mg by mouth daily.) 90 tablet 3   meloxicam (MOBIC) 15 MG tablet Take 15 mg by mouth daily.     sildenafil (REVATIO) 20 MG tablet TAKE 1-2 TABLETS BY MOUTH DAILY AS NEEDED AS DIRECTED BY PHYSICIAN. 90 tablet 3   No current facility-administered medications for this visit.     PHYSICAL EXAMINATION:   Vitals:   07/07/23 1126 07/07/23 1216  BP: (!) 154/111 (!) 153/110  Pulse:    Temp:    SpO2:     Filed Weights   07/07/23 1047  Weight: (!) 303 lb 9.6 oz (137.7 kg)    Physical Exam Vitals and nursing note reviewed.  HENT:     Head: Normocephalic and atraumatic.     Mouth/Throat:      Pharynx: Oropharynx is clear.  Eyes:     Extraocular Movements: Extraocular movements intact.     Pupils: Pupils are equal, round, and reactive to light.  Cardiovascular:     Rate and Rhythm: Normal rate and regular rhythm.  Pulmonary:     Comments: Decreased breath sounds bilaterally.  Abdominal:     Palpations: Abdomen is soft.  Musculoskeletal:        General: Normal range of motion.  Cervical back: Normal range of motion.  Skin:    General: Skin is warm.  Neurological:     General: No focal deficit present.     Mental Status: He is alert and oriented to person, place, and time.  Psychiatric:        Behavior: Behavior normal.        Judgment: Judgment normal.      LABORATORY DATA:  I have reviewed the data as listed Lab Results  Component Value Date   WBC 7.9 07/07/2023   HGB 18.3 (H) 07/07/2023   HCT 56.7 (H) 07/07/2023   MCV 93.4 07/07/2023   PLT 183 07/07/2023   Recent Labs    07/07/23 1218  NA 137  K 4.5  CL 101  CO2 28  GLUCOSE 91  BUN 11  CREATININE 0.69  CALCIUM 8.9  GFRNONAA >60  PROT 7.3  ALBUMIN 4.3  AST 16  ALT 23  ALKPHOS 79  BILITOT 0.6     No results found.  ASSESSMENT & PLAN:   Erythrocytosis # Erythrocytosis- [since 2022] currently patient is symptomatic. PCP-JAK2 V617F (p.Val617Phe; c.1849G>T) mutation not detected. Jak- Exon 12- NEG  # I had a long discussion the patient regarding the potential causes of her abnormal blood counts-both primary bone marrow problem versus reactive/secondary causes [smoking, OSA diuretics etc.].  Likely secondary as JAK2 testing is negative.  For now I  recommend checking CBC;CMP jak 2; LDH; erythropoietin levels; carbon monoxide levels.  HOLD OFF bone marrow at this time. #I discussed the role of phlebotomy in bringing the hematocrit/hemoglobin down-avoid any thromboembolic events.  The role of phlebotomy in secondary erythrocytosis is unclear/controversial.  However, phlebotomy is a standard of  care [goal hematocrit less than 45] for polycythemia vera.   # Mild leukocytosis again likely secondary to inflammatory-smoking.  Check CRP.  Check BCR-ABL.  # Active smoker: Discussed with the patient regarding the ill effects of smoking- including but not limited to cardiac lung and vascular diseases and malignancies. Counseled against smoking; patient- interested. Recommend talking to her PCP regarding smoking cessation. Declines chantinx [vivid dream]; wants to quit   # HNT- diastolic 113; [white coaot]- better at home; repeat blood pressure  Thank you Dr.Smith or allowing me to participate in the care of your pleasant patient. Please do not hesitate to contact me with questions or concerns in the interim.  # DISPOSITION: # repeat BP today # labs- ordered # follow up in 2-3 weeks- MD: no labs- Dr.B  # 45 minutes face-to-face with the patient discussing the above plan of care; more than 50% of time spent on counseling and coordination. My contact information was given; and all questions were answered. The patient knows to call the clinic with any problems, questions or concerns.    Earna Coder, MD 07/07/2023 5:43 PM

## 2023-07-07 NOTE — Progress Notes (Signed)
Pt has 2 cousins that have to get phlebotomy. Unsure of dx. Son also has increased RBC.       C/o  Burning and stinging in legs, happened twice.  Wife states he is "a free bleeder."

## 2023-07-08 LAB — CARBON MONOXIDE, BLOOD (PERFORMED AT REF LAB): Carbon Monoxide, Blood: 8.2 % — ABNORMAL HIGH (ref 0.0–3.6)

## 2023-07-08 LAB — ERYTHROPOIETIN: Erythropoietin: 26.1 m[IU]/mL — ABNORMAL HIGH (ref 2.6–18.5)

## 2023-07-10 LAB — BCR-ABL1 FISH
Cells Analyzed: 200
Cells Counted: 200

## 2023-07-22 ENCOUNTER — Encounter: Payer: Self-pay | Admitting: Internal Medicine

## 2023-07-22 ENCOUNTER — Inpatient Hospital Stay (HOSPITAL_BASED_OUTPATIENT_CLINIC_OR_DEPARTMENT_OTHER): Payer: BC Managed Care – PPO | Admitting: Internal Medicine

## 2023-07-22 VITALS — BP 150/100 | HR 98 | Temp 98.3°F | Ht 72.84 in | Wt 300.3 lb

## 2023-07-22 DIAGNOSIS — Z720 Tobacco use: Secondary | ICD-10-CM | POA: Diagnosis not present

## 2023-07-22 DIAGNOSIS — R059 Cough, unspecified: Secondary | ICD-10-CM

## 2023-07-22 DIAGNOSIS — D751 Secondary polycythemia: Secondary | ICD-10-CM | POA: Diagnosis not present

## 2023-07-22 NOTE — Progress Notes (Signed)
[facility] CONSULT NOTE  Patient Care Team: Ethelda Chick, MD as PCP - General (Family Medicine) Earna Coder, MD as Consulting Physician (Internal Medicine)  CHIEF COMPLAINTS/PURPOSE OF CONSULTATION: ERYTHROCYTOSIS   HEMATOLOGY HISTORY  # ERYTHROCYTOSIS  [Hb; WBC; platelets] Smoking:1ppd.  Lung Disease/COPD:none OSA/CPAP: hx of snore; NO Sleep study.  Hx of DVT/PE or Stroke: none Testosterone: none Diuretics: none Interestingly notes that cousins have similar problems with elevated counts needing phlebotomies.   HISTORY OF PRESENTING ILLNESS: Patient ambulating-independently. Accompanied by family.   Kristopher Bell 47 y.o.  male pleasant patient with longstanding history of smoking without any diagnosis of COPD is here to review results of the blood work ordered for erythrocytosis/mild leukocytosis.  Patient states he is smoking a lot less than last visit, down 1/2 a pack a day. Go over lab results from last visit.   Asking about getting a chest x- ray today.    Review of Systems  Constitutional:  Negative for chills, diaphoresis, fever, malaise/fatigue and weight loss.  HENT:  Negative for nosebleeds and sore throat.   Eyes:  Negative for double vision.  Respiratory:  Negative for cough, hemoptysis, sputum production, shortness of breath and wheezing.   Cardiovascular:  Negative for chest pain, palpitations, orthopnea and leg swelling.  Gastrointestinal:  Negative for abdominal pain, blood in stool, constipation, diarrhea, heartburn, melena, nausea and vomiting.  Genitourinary:  Negative for dysuria, frequency and urgency.  Musculoskeletal:  Negative for back pain and joint pain.  Skin: Negative.  Negative for itching and rash.  Neurological:  Negative for dizziness, tingling, focal weakness, weakness and headaches.  Endo/Heme/Allergies:  Does not bruise/bleed easily.  Psychiatric/Behavioral:  Negative for depression. The patient is not  nervous/anxious and does not have insomnia.      MEDICAL HISTORY:  Past Medical History:  Diagnosis Date   Hypertension 09/29/2005   onset age 81.   Nephrolithiasis    age 82.  Single episode.  Harmon/Urologist.   Obesity, unspecified    Tobacco use disorder    Toxic effect of venom(989.5)     SURGICAL HISTORY: History reviewed. No pertinent surgical history.  SOCIAL HISTORY: Social History   Socioeconomic History   Marital status: Married    Spouse name: Not on file   Number of children: 2   Years of education: 12   Highest education level: Not on file  Occupational History   Occupation: body shop    Employer: [employer]    Comment: [employer] x 22 years  Tobacco Use   Smoking status: Every Day    Current packs/day: 0.50    Average packs/day: 0.5 packs/day for 20.0 years (10.0 ttl pk-yrs)    Types: Cigarettes   Smokeless tobacco: Current    Types: Snuff, Chew  Vaping Use   Vaping status: Never Used  Substance and Sexual Activity   Alcohol use: No    Comment: socially; twice yearly.   Drug use: No   Sexual activity: Yes    Comment: wife BTL.  Other Topics Concern   Not on file  Social History Narrative   Marital status:  married x 17 years. Feels safe at home.     Children: 2 sons (19, 67)      Lives: with wife, 1 son.      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      Exercise: work is physically demanding; no formal exercise program.  Seatbelt:  100% of time. No texting.      Guns: 15 guns; unloaded.        Sunscreen:  SPF 50.        Smoke alarm in the home.      Caffeine use:Moderate amount.      Tobacco:  1ppd x 22 years; quit one time for two weeks.      Alcohol: none      Drugs: none   Social Determinants of Health   Financial Resource Strain: Low Risk  (05/28/2023)   Received from [facility]   Overall Financial Resource Strain (CARDIA)    Difficulty of Paying Living Expenses: Not hard at all  Food  Insecurity: No Food Insecurity (07/07/2023)   Hunger Vital Sign    Worried About Running Out of Food in the Last Year: Never true    Ran Out of Food in the Last Year: Never true  Transportation Needs: No Transportation Needs (07/07/2023)   PRAPARE - Administrator, Civil Service (Medical): No    Lack of Transportation (Non-Medical): No  Physical Activity: Inactive (05/28/2023)   Received from [facility]   Exercise Vital Sign    Days of Exercise per Week: 0 days    Minutes of Exercise per Session: 0 min  Stress: Not on file  Social Connections: Not on file  Intimate Partner Violence: Not At Risk (07/07/2023)   Humiliation, Afraid, Rape, and Kick questionnaire    Fear of Current or Ex-Partner: No    Emotionally Abused: No    Physically Abused: No    Sexually Abused: No    FAMILY HISTORY: Family History  Problem Relation Age of Onset   Kidney disease Mother        spastic bladder, hematuria.   Diverticulitis Mother    Diabetes Father    Stroke Father 50   Hypertension Father 86   Heart disease Father 21       cardiac stenting; no AMI.   Kidney failure Father        HD    ALLERGIES:  is allergic to bee venom.  MEDICATIONS:  Current Outpatient Medications  Medication Sig Dispense Refill   lisinopril (PRINIVIL,ZESTRIL) 5 MG tablet Take 1 tablet (5 mg total) by mouth daily. (Patient taking differently: Take 20 mg by mouth daily.) 90 tablet 3   meloxicam (MOBIC) 15 MG tablet Take 15 mg by mouth daily.     sildenafil (REVATIO) 20 MG tablet TAKE 1-2 TABLETS BY MOUTH DAILY AS NEEDED AS DIRECTED BY PHYSICIAN. 90 tablet 3   No current facility-administered medications for this visit.     PHYSICAL EXAMINATION:   Vitals:   07/22/23 0825 07/22/23 0834  BP: (!) 154/106 (!) 150/100  Pulse: 98   Temp: 98.3 F (36.8 C)   SpO2: 96%     Filed Weights   07/22/23 0825  Weight: (!) 300 lb 4.8 oz (136.2 kg)     Physical Exam Vitals and nursing  note reviewed.  HENT:     Head: Normocephalic and atraumatic.     Mouth/Throat:     Pharynx: Oropharynx is clear.  Eyes:     Extraocular Movements: Extraocular movements intact.     Pupils: Pupils are equal, round, and reactive to light.  Cardiovascular:     Rate and Rhythm: Normal rate and regular rhythm.  Pulmonary:     Comments: Decreased breath sounds bilaterally.  Abdominal:     Palpations: Abdomen is  soft.  Musculoskeletal:        General: Normal range of motion.     Cervical back: Normal range of motion.  Skin:    General: Skin is warm.  Neurological:     General: No focal deficit present.     Mental Status: He is alert and oriented to person, place, and time.  Psychiatric:        Behavior: Behavior normal.        Judgment: Judgment normal.      LABORATORY DATA:  I have reviewed the data as listed Lab Results  Component Value Date   WBC 7.9 07/07/2023   HGB 18.3 (H) 07/07/2023   HCT 56.7 (H) 07/07/2023   MCV 93.4 07/07/2023   PLT 183 07/07/2023   Recent Labs    07/07/23 1218  NA 137  K 4.5  CL 101  CO2 28  GLUCOSE 91  BUN 11  CREATININE 0.69  CALCIUM 8.9  GFRNONAA >60  PROT 7.3  ALBUMIN 4.3  AST 16  ALT 23  ALKPHOS 79  BILITOT 0.6     No results found.  ASSESSMENT & PLAN:   Erythrocytosis # Erythrocytosis'/ Mild leucocytosis-- [since 2022] currently patient is symptomatic. PCP-JAK2 V617F (p.Val617Phe; c.1849G>T) mutation not detected. Jak- Exon 12- NEG [PCP].-Carbon monoxide elevated likely secondary to smoking. BCR-ABL negative. Would not recommend a bone marrow biopsy for further evaluation at this time.  CRP elevated.  # Discussed that the role of phlebotomy in secondary erythrocytosis is unclear/controversial.  I would recommend phlebotomy if greater than 55. [facility] [place] [facility].   # Active smoker: Discussed with the patient regarding the ill effects of smoking- including but not limited to cardiac lung and vascular  diseases and malignancies. Counseled against smoking; patient- interested; has started to cut down on smoking.  Declines chantinx [vivid dream]; wants to quit .  Also recommend sleep study. Defer to PCP. CXR-defer to PCP.   # HNT- diastolic 113; [white coaot]- better at home; defer to PCP re: further management  # DISPOSITION: # follow up as needed- Dr.B     Earna Coder, MD 07/22/2023 9:24 AM

## 2023-07-22 NOTE — Progress Notes (Signed)
Pt states he is smoking a lot less than last visit, down 1/2 a pack a day.  Go over lab results from last visit.  Asking about getting a chest xray today.

## 2023-07-22 NOTE — Assessment & Plan Note (Addendum)
#   Erythrocytosis'/ Mild leucocytosis-- [since 2022] currently patient is symptomatic. PCP-JAK2 V617F (p.Val617Phe; c.1849G>T) mutation not detected. Jak- Exon 12- NEG [PCP].-Carbon monoxide elevated likely secondary to smoking. BCR-ABL negative. Would not recommend a bone marrow biopsy for further evaluation at this time.  CRP elevated.  # Discussed that the role of phlebotomy in secondary erythrocytosis is unclear/controversial.  I would recommend phlebotomy if greater than 55. [facility] could [facility].   # Active smoker: Discussed with the patient regarding the ill effects of smoking- including but not limited to cardiac lung and vascular diseases and malignancies. Counseled against smoking; patient- interested; has started to cut down on smoking.  Declines chantinx [vivid dream]; wants to quit .  Also recommend sleep study. Defer to PCP. CXR-defer to PCP.   # HNT- diastolic 113; [white coaot]- better at home; defer to PCP re: further management  # DISPOSITION: # follow up as needed- Dr.B
